# Patient Record
Sex: Female | Born: 1990 | Race: White | Hispanic: No | Marital: Single | Smoking: Current every day smoker
Health system: Southern US, Community
[De-identification: ages and names within clinical notes are randomized; demographics above are authoritative.]

## PROBLEM LIST (undated history)

## (undated) DIAGNOSIS — G43909 Migraine, unspecified, not intractable, without status migrainosus: Secondary | ICD-10-CM

## (undated) DIAGNOSIS — N930 Postcoital and contact bleeding: Secondary | ICD-10-CM

## (undated) DIAGNOSIS — N83201 Unspecified ovarian cyst, right side: Secondary | ICD-10-CM

## (undated) DIAGNOSIS — E041 Nontoxic single thyroid nodule: Secondary | ICD-10-CM

## (undated) DIAGNOSIS — R309 Painful micturition, unspecified: Secondary | ICD-10-CM

## (undated) DIAGNOSIS — Z309 Encounter for contraceptive management, unspecified: Secondary | ICD-10-CM

## (undated) DIAGNOSIS — N949 Unspecified condition associated with female genital organs and menstrual cycle: Secondary | ICD-10-CM

## (undated) DIAGNOSIS — R5383 Other fatigue: Secondary | ICD-10-CM

## (undated) DIAGNOSIS — S42309A Unspecified fracture of shaft of humerus, unspecified arm, initial encounter for closed fracture: Secondary | ICD-10-CM

## (undated) DIAGNOSIS — R1011 Right upper quadrant pain: Secondary | ICD-10-CM

## (undated) HISTORY — PX: WISDOM TOOTH EXTRACTION: SHX21

## (undated) HISTORY — DX: Right upper quadrant pain: R10.11

## (undated) HISTORY — DX: Other fatigue: R53.83

## (undated) HISTORY — DX: Nontoxic single thyroid nodule: E04.1

## (undated) HISTORY — DX: Unspecified condition associated with female genital organs and menstrual cycle: N94.9

## (undated) HISTORY — DX: Encounter for contraceptive management, unspecified: Z30.9

## (undated) HISTORY — DX: Unspecified ovarian cyst, right side: N83.201

## (undated) HISTORY — DX: Migraine, unspecified, not intractable, without status migrainosus: G43.909

## (undated) HISTORY — DX: Unspecified fracture of shaft of humerus, unspecified arm, initial encounter for closed fracture: S42.309A

## (undated) HISTORY — DX: Painful micturition, unspecified: R30.9

## (undated) HISTORY — DX: Postcoital and contact bleeding: N93.0

---
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
## 2009-04-21 ENCOUNTER — Other Ambulatory Visit: Admission: RE | Admit: 2009-04-21 | Discharge: 2009-04-21 | Payer: Self-pay | Admitting: Obstetrics and Gynecology

## 2009-08-13 ENCOUNTER — Encounter: Admission: RE | Admit: 2009-08-13 | Discharge: 2009-08-13 | Payer: Self-pay | Admitting: Oral Surgery

## 2010-05-05 ENCOUNTER — Other Ambulatory Visit: Payer: Self-pay | Admitting: Otolaryngology

## 2010-05-05 DIAGNOSIS — R490 Dysphonia: Secondary | ICD-10-CM

## 2010-05-06 ENCOUNTER — Ambulatory Visit
Admission: RE | Admit: 2010-05-06 | Discharge: 2010-05-06 | Disposition: A | Discharge status: Home / Self Care | Payer: BC Managed Care – PPO | Source: Ambulatory Visit | Attending: Otolaryngology | Admitting: Otolaryngology

## 2010-05-06 DIAGNOSIS — R490 Dysphonia: Secondary | ICD-10-CM

## 2010-08-11 ENCOUNTER — Other Ambulatory Visit: Payer: Self-pay | Admitting: Adult Health

## 2010-08-11 ENCOUNTER — Other Ambulatory Visit (HOSPITAL_COMMUNITY)
Admission: RE | Admit: 2010-08-11 | Discharge: 2010-08-11 | Disposition: A | Discharge status: Home / Self Care | Payer: BC Managed Care – PPO | Source: Ambulatory Visit | Attending: Obstetrics and Gynecology | Admitting: Obstetrics and Gynecology

## 2010-08-11 DIAGNOSIS — Z01419 Encounter for gynecological examination (general) (routine) without abnormal findings: Secondary | ICD-10-CM | POA: Insufficient documentation

## 2010-08-11 DIAGNOSIS — Z113 Encounter for screening for infections with a predominantly sexual mode of transmission: Secondary | ICD-10-CM | POA: Insufficient documentation

## 2011-03-18 ENCOUNTER — Other Ambulatory Visit: Payer: Self-pay | Admitting: Endocrinology

## 2011-03-18 DIAGNOSIS — E041 Nontoxic single thyroid nodule: Secondary | ICD-10-CM

## 2011-03-19 ENCOUNTER — Other Ambulatory Visit: Payer: BC Managed Care – PPO

## 2011-03-23 ENCOUNTER — Ambulatory Visit
Admission: RE | Admit: 2011-03-23 | Discharge: 2011-03-23 | Disposition: A | Discharge status: Home / Self Care | Payer: BC Managed Care – PPO | Source: Ambulatory Visit | Attending: Endocrinology | Admitting: Endocrinology

## 2011-03-23 DIAGNOSIS — E041 Nontoxic single thyroid nodule: Secondary | ICD-10-CM

## 2012-09-04 ENCOUNTER — Other Ambulatory Visit: Payer: Self-pay | Admitting: Adult Health

## 2012-09-14 ENCOUNTER — Other Ambulatory Visit: Payer: Self-pay | Admitting: Adult Health

## 2012-09-26 ENCOUNTER — Other Ambulatory Visit (HOSPITAL_COMMUNITY)
Admission: RE | Admit: 2012-09-26 | Discharge: 2012-09-26 | Disposition: A | Discharge status: Home / Self Care | Payer: BC Managed Care – PPO | Source: Ambulatory Visit | Attending: Obstetrics and Gynecology | Admitting: Obstetrics and Gynecology

## 2012-09-26 ENCOUNTER — Ambulatory Visit (INDEPENDENT_AMBULATORY_CARE_PROVIDER_SITE_OTHER): Payer: BC Managed Care – PPO | Admitting: Adult Health

## 2012-09-26 ENCOUNTER — Encounter: Payer: Self-pay | Admitting: Adult Health

## 2012-09-26 VITALS — BP 120/80 | HR 76 | Ht 63.0 in | Wt 168.0 lb

## 2012-09-26 DIAGNOSIS — Z01419 Encounter for gynecological examination (general) (routine) without abnormal findings: Secondary | ICD-10-CM

## 2012-09-26 DIAGNOSIS — Z309 Encounter for contraceptive management, unspecified: Secondary | ICD-10-CM

## 2012-09-26 DIAGNOSIS — E041 Nontoxic single thyroid nodule: Secondary | ICD-10-CM

## 2012-09-26 HISTORY — DX: Encounter for contraceptive management, unspecified: Z30.9

## 2012-09-26 MED ORDER — NORGESTIMATE-ETH ESTRADIOL 0.25-35 MG-MCG PO TABS: ORAL_TABLET | ORAL | Status: DC

## 2012-09-26 NOTE — Progress Notes (Signed)
Patient ID: Rachel Higgins, female   DOB: 05-11-1990, 22 y.o.   MRN: [id number] History of Present Illness: Letitia is a 22 year old white female engaged in for a pap and physical.   Current Medications, Allergies, Past Medical History, Past Surgical History, Family History and Social History were reviewed in [employer] medical record.     Review of Systems: Patient denies any headaches, blurred vision, shortness of breath, chest pain, abdominal pain, problems with bowel movements, urination, or intercourse. No joint pain or mood changes.    Physical Exam:BP 120/80  Pulse 76  Ht 5\' 3"  (1.6 m)  Wt 168 lb (76.204 kg)  BMI 29.77 kg/m2  LMP 08/29/2012 General:  Well developed, well nourished, no acute distress  Skin:  Warm and dry, tan Neck:  Midline trachea, right thyroid nodule(sees Dr Evlyn Kanner) Lungs; Clear to auscultation bilaterally Breast:  No dominant palpable mass, retraction, or nipple discharge Cardiovascular: Regular rate and rhythm Abdomen:  Soft, non tender, no hepatosplenomegaly Pelvic:  External genitalia is normal in appearance.  The vagina is normal in appearance. The cervix is nulleparous.Pap performed with thin prep.  Uterus is felt to be normal size, shape, and contour.  No adnexal masses or tenderness noted. Extremities:  No swelling or varicosities noted Psych:  Alert and cooperative, seems happy   Impression: Yearly gyn exam Contraceptive management Right thyroid nodule  Plan: Physical in 1 year Refilled Sprintec x 1 year, set phone to remind her to take

## 2012-09-26 NOTE — Patient Instructions (Addendum)
Continue pills Follow up in 1 year

## 2012-10-16 ENCOUNTER — Telehealth: Payer: Self-pay | Admitting: Obstetrics and Gynecology

## 2012-10-16 NOTE — Telephone Encounter (Signed)
Pt states taking Sprintec 28 x 2 years, starting heavy period and cramps. Call transferred to front staff for next available appt with provider.

## 2013-02-16 ENCOUNTER — Encounter (HOSPITAL_COMMUNITY): Payer: Self-pay | Admitting: Emergency Medicine

## 2013-02-16 DIAGNOSIS — M542 Cervicalgia: Secondary | ICD-10-CM | POA: Insufficient documentation

## 2013-02-16 DIAGNOSIS — R52 Pain, unspecified: Secondary | ICD-10-CM | POA: Insufficient documentation

## 2013-02-16 DIAGNOSIS — G8929 Other chronic pain: Secondary | ICD-10-CM | POA: Insufficient documentation

## 2013-02-16 DIAGNOSIS — R51 Headache: Secondary | ICD-10-CM | POA: Insufficient documentation

## 2013-02-16 DIAGNOSIS — Z3202 Encounter for pregnancy test, result negative: Secondary | ICD-10-CM | POA: Insufficient documentation

## 2013-02-16 DIAGNOSIS — F17200 Nicotine dependence, unspecified, uncomplicated: Secondary | ICD-10-CM | POA: Insufficient documentation

## 2013-02-16 DIAGNOSIS — B9789 Other viral agents as the cause of diseases classified elsewhere: Secondary | ICD-10-CM | POA: Insufficient documentation

## 2013-02-16 DIAGNOSIS — Z79899 Other long term (current) drug therapy: Secondary | ICD-10-CM | POA: Insufficient documentation

## 2013-02-16 NOTE — ED Notes (Signed)
Pt states she has nodule on thyroid that is swelling and causing muscle spasms in her neck and states she has been running a fever tonight.

## 2013-02-17 ENCOUNTER — Emergency Department (HOSPITAL_COMMUNITY)
Admission: EM | Admit: 2013-02-17 | Discharge: 2013-02-17 | Disposition: A | Discharge status: Home / Self Care | Payer: BC Managed Care – PPO | Attending: Emergency Medicine | Admitting: Emergency Medicine

## 2013-02-17 DIAGNOSIS — B349 Viral infection, unspecified: Secondary | ICD-10-CM

## 2013-02-17 LAB — CBC WITH DIFFERENTIAL/PLATELET
Basophils Absolute: 0 10*3/uL (ref 0.0–0.1)
Lymphs Abs: 1.6 10*3/uL (ref 0.7–4.0)
MCH: 33.2 pg (ref 26.0–34.0)
MCV: 95 fL (ref 78.0–100.0)
Monocytes Relative: 10 % (ref 3–12)
Neutro Abs: 3.3 10*3/uL (ref 1.7–7.7)
Neutrophils Relative %: 60 % (ref 43–77)
Platelets: 188 10*3/uL (ref 150–400)

## 2013-02-17 LAB — URINALYSIS, ROUTINE W REFLEX MICROSCOPIC
Glucose, UA: NEGATIVE mg/dL
Ketones, ur: NEGATIVE mg/dL
Nitrite: NEGATIVE
pH: 6 (ref 5.0–8.0)

## 2013-02-17 LAB — BASIC METABOLIC PANEL
Creatinine, Ser: 0.74 mg/dL (ref 0.50–1.10)
GFR calc Af Amer: >90 mL/min (ref >90)
Glucose, Bld: 112 mg/dL — ABNORMAL HIGH (ref 70–99)
Potassium: 3.9 mEq/L (ref 3.5–5.1)

## 2013-02-17 LAB — URINE MICROSCOPIC-ADD ON

## 2013-02-17 LAB — RAPID STREP SCREEN (MED CTR MEBANE ONLY): Streptococcus, Group A Screen (Direct): NEGATIVE

## 2013-02-17 NOTE — ED Provider Notes (Signed)
CSN: [id number]     Arrival date & time 02/16/13  2242 History   None    Chief Complaint  Patient presents with  . Neck Pain   (Consider location/radiation/quality/duration/timing/severity/associated sxs/prior Treatment) HPI This is a 22 year old female with a long-standing history of a "thyroid cyst" sitting just posterior to her right sternocleidomastoid muscle. It is associated with chronic neck pain and headaches. She is here with a two-day history of fever to 99.5 (which she states is high for her; her temperature is normally 93 or 94), chills, body aches, increased right-sided neck pain and tender lymph nodes, notably of her neck and right axilla. She is at increased first but no significant increase in urine output. She denies dysuria, nausea, vomiting or diarrhea. She denies cough, dyspnea or chest pain.  Past Medical History  Diagnosis Date  . Contraceptive management 09/26/2012   Past Surgical History  Procedure Laterality Date  . Wisdom tooth extraction    . Tonsillectomy     Family History  Problem Relation Age of Onset  . Vision loss Mother   . Thyroid disease Maternal Aunt   . Cancer Maternal Grandfather     thyroid   . Cancer Paternal Grandmother     breast, lung   . Stroke Paternal Grandmother   . Arthritis Paternal Grandmother   . COPD Paternal Grandmother   . Heart disease Paternal Grandmother   . Diabetes Paternal Grandfather    History  Substance Use Topics  . Smoking status: Current Every Day Smoker -- 0.50 packs/day for 4 years    Types: Cigarettes  . Smokeless tobacco: Never Used  . Alcohol Use: No   OB History   Grav Para Term Preterm Abortions TAB SAB Ect Mult Living                 Review of Systems  All other systems reviewed and are negative.    Allergies  Review of patient's allergies indicates no known allergies.  Home Medications   Current Outpatient Rx  Name  Route  Sig  Dispense  Refill  . norgestimate-ethinyl estradiol  (SPRINTEC 28) 0.25-35 MG-MCG tablet      TAKE ONE TABLET BY MOUTH EVERY DAY   1 Package   11    BP 130/98  Pulse 108  Temp(Src) 98.1 F (36.7 C) (Oral)  Resp 20  Ht 5\' 2"  (1.575 m)  Wt 160 lb (72.576 kg)  BMI 29.26 kg/m2  SpO2 98%  LMP 01/18/2013  Physical Exam General: Well-developed, well-nourished female in no acute distress; appearance consistent with age of record HENT: normocephalic; atraumatic; no pharyngeal erythema or exudate Eyes: pupils equal, round and reactive to light; extraocular muscles intact Neck: supple; small, tender nodule posterior to right sternocleidomastoid muscle; tender cervical lymph nodes without significant enlargement Heart: regular rate and rhythm; no murmurs, rubs or gallops Lungs: clear to auscultation bilaterally Abdomen: soft; nondistended; nontender; no masses or hepatosplenomegaly; bowel sounds present; no inguinal lymphadenopathy Extremities: No deformity; full range of motion; pulses normal; tender right axillary lymph nodes without significant enlargement Neurologic: Awake, alert and oriented; motor function intact in all extremities and symmetric; no facial droop Skin: Warm and dry Psychiatric: Normal mood and affect    ED Course  Procedures (including critical care time)    MDM   Nursing notes and vitals signs, including pulse oximetry, reviewed.  Summary of this visit's results, reviewed by myself:  Labs:  Results for orders placed during the hospital encounter of 02/17/13 (from the  past 24 hour(s))  RAPID STREP SCREEN     Status: None   Collection Time    02/17/13  1:11 AM      Result Value Range   Streptococcus, Group A Screen (Direct) NEGATIVE  NEGATIVE  MONONUCLEOSIS SCREEN     Status: None   Collection Time    02/17/13  1:14 AM      Result Value Range   Mono Screen NEGATIVE  NEGATIVE  CBC WITH DIFFERENTIAL     Status: None   Collection Time    02/17/13  1:14 AM      Result Value Range   WBC 5.5  4.0 - 10.5  K/uL   RBC 4.37  3.87 - 5.11 MIL/uL   Hemoglobin 14.5  12.0 - 15.0 g/dL   HCT 95.6  21.3 - 08.6 %   MCV 95.0  78.0 - 100.0 fL   MCH 33.2  26.0 - 34.0 pg   MCHC 34.9  30.0 - 36.0 g/dL   RDW 57.8  46.9 - 62.9 %   Platelets 188  150 - 400 K/uL   Neutrophils Relative % 60  43 - 77 %   Neutro Abs 3.3  1.7 - 7.7 K/uL   Lymphocytes Relative 29  12 - 46 %   Lymphs Abs 1.6  0.7 - 4.0 K/uL   Monocytes Relative 10  3 - 12 %   Monocytes Absolute 0.6  0.1 - 1.0 K/uL   Eosinophils Relative 0  0 - 5 %   Eosinophils Absolute 0.0  0.0 - 0.7 K/uL   Basophils Relative 0  0 - 1 %   Basophils Absolute 0.0  0.0 - 0.1 K/uL  BASIC METABOLIC PANEL     Status: Abnormal   Collection Time    02/17/13  1:14 AM      Result Value Range   Sodium 137  135 - 145 mEq/L   Potassium 3.9  3.5 - 5.1 mEq/L   Chloride 101  96 - 112 mEq/L   CO2 25  19 - 32 mEq/L   Glucose, Bld 112 (*) 70 - 99 mg/dL   BUN 8  6 - 23 mg/dL   Creatinine, Ser 5.28  0.50 - 1.10 mg/dL   Calcium 9.0  8.4 - 41.3 mg/dL   GFR calc non Af Amer >90  >90 mL/min   GFR calc Af Amer >90  >90 mL/min  URINALYSIS, ROUTINE W REFLEX MICROSCOPIC     Status: Abnormal   Collection Time    02/17/13  1:16 AM      Result Value Range   Color, Urine YELLOW  YELLOW   APPearance CLEAR  CLEAR   Specific Gravity, Urine 1.015  1.005 - 1.030   pH 6.0  5.0 - 8.0   Glucose, UA NEGATIVE  NEGATIVE mg/dL   Hgb urine dipstick LARGE (*) NEGATIVE   Bilirubin Urine NEGATIVE  NEGATIVE   Ketones, ur NEGATIVE  NEGATIVE mg/dL   Protein, ur NEGATIVE  NEGATIVE mg/dL   Urobilinogen, UA 0.2  0.0 - 1.0 mg/dL   Nitrite NEGATIVE  NEGATIVE   Leukocytes, UA SMALL (*) NEGATIVE  PREGNANCY, URINE     Status: None   Collection Time    02/17/13  1:16 AM      Result Value Range   Preg Test, Ur NEGATIVE  NEGATIVE  URINE MICROSCOPIC-ADD ON     Status: Abnormal   Collection Time    02/17/13  1:16 AM      Result  Value Range   Squamous Epithelial / LPF FEW (*) RARE   WBC, UA 3-6  <3  WBC/hpf   RBC / HPF 3-6  <3 RBC/hpf   Bacteria, UA RARE  RARE    Patient symptoms are consistent with a viral illness. She was advised to return for worsening or changing symptoms.   Hanley Seamen, MD 02/17/13 [phone]

## 2013-02-19 LAB — CULTURE, GROUP A STREP

## 2013-07-03 ENCOUNTER — Encounter: Payer: Self-pay | Admitting: Adult Health

## 2013-07-03 ENCOUNTER — Ambulatory Visit (INDEPENDENT_AMBULATORY_CARE_PROVIDER_SITE_OTHER): Payer: BC Managed Care – PPO | Admitting: Adult Health

## 2013-07-03 VITALS — BP 130/82 | Ht 63.0 in | Wt 188.0 lb

## 2013-07-03 DIAGNOSIS — R309 Painful micturition, unspecified: Secondary | ICD-10-CM

## 2013-07-03 DIAGNOSIS — Z3202 Encounter for pregnancy test, result negative: Secondary | ICD-10-CM

## 2013-07-03 DIAGNOSIS — N949 Unspecified condition associated with female genital organs and menstrual cycle: Secondary | ICD-10-CM | POA: Insufficient documentation

## 2013-07-03 DIAGNOSIS — R300 Dysuria: Secondary | ICD-10-CM

## 2013-07-03 DIAGNOSIS — R319 Hematuria, unspecified: Secondary | ICD-10-CM

## 2013-07-03 HISTORY — DX: Hematuria, unspecified: R31.9

## 2013-07-03 HISTORY — DX: Painful micturition, unspecified: R30.9

## 2013-07-03 HISTORY — DX: Unspecified condition associated with female genital organs and menstrual cycle: N94.9

## 2013-07-03 LAB — SEDIMENTATION RATE: SED RATE: 10 mm/h (ref 0–22)

## 2013-07-03 LAB — CBC
HEMATOCRIT: 41.3 % (ref 36.0–46.0)
Hemoglobin: 14.3 g/dL (ref 12.0–15.0)
MCH: 32.2 pg (ref 26.0–34.0)
MCHC: 34.6 g/dL (ref 30.0–36.0)
MCV: 93 fL (ref 78.0–100.0)
Platelets: 314 10*3/uL (ref 150–400)
RBC: 4.44 MIL/uL (ref 3.87–5.11)
RDW: 13.3 % (ref 11.5–15.5)
WBC: 11.1 10*3/uL — AB (ref 4.0–10.5)

## 2013-07-03 LAB — COMPREHENSIVE METABOLIC PANEL
ALBUMIN: 4.1 g/dL (ref 3.5–5.2)
ALT: 37 U/L — AB (ref 0–35)
AST: 26 U/L (ref 0–37)
Alkaline Phosphatase: 56 U/L (ref 39–117)
BUN: 9 mg/dL (ref 6–23)
CO2: 27 meq/L (ref 19–32)
Calcium: 9.8 mg/dL (ref 8.4–10.5)
Chloride: 104 mEq/L (ref 96–112)
Creat: 0.72 mg/dL (ref 0.50–1.10)
Glucose, Bld: 71 mg/dL (ref 70–99)
Potassium: 4.4 mEq/L (ref 3.5–5.3)
Sodium: 139 mEq/L (ref 135–145)
Total Bilirubin: 0.3 mg/dL (ref 0.2–1.2)
Total Protein: 6.9 g/dL (ref 6.0–8.3)

## 2013-07-03 LAB — POCT URINALYSIS DIPSTICK
Blood, UA: 1
GLUCOSE UA: NEGATIVE
Ketones, UA: NEGATIVE
Leukocytes, UA: NEGATIVE
NITRITE UA: NEGATIVE
PROTEIN UA: NEGATIVE

## 2013-07-03 LAB — POCT URINE PREGNANCY: PREG TEST UR: NEGATIVE

## 2013-07-03 MED ORDER — IBUPROFEN 800 MG PO TABS: 800.0000 mg | ORAL_TABLET | Freq: Three times a day (TID) | ORAL | Status: DC | PRN

## 2013-07-03 MED ORDER — PHENAZOPYRIDINE HCL 200 MG PO TABS: 200.0000 mg | ORAL_TABLET | Freq: Three times a day (TID) | ORAL | Status: DC | PRN

## 2013-07-03 MED ORDER — SULFAMETHOXAZOLE-TMP DS 800-160 MG PO TABS: 1.0000 | ORAL_TABLET | Freq: Two times a day (BID) | ORAL | Status: DC

## 2013-07-03 NOTE — Patient Instructions (Signed)
Hematuria, Adult Hematuria is blood in your urine. It can be caused by a bladder infection, kidney infection, prostate infection, kidney stone, or cancer of your urinary tract. Infections can usually be treated with medicine, and a kidney stone usually will pass through your urine. If neither of these is the cause of your hematuria, further workup to find out the reason may be needed. It is very important that you tell your health care provider about any blood you see in your urine, even if the blood stops without treatment or happens without causing pain. Blood in your urine that happens and then stops and then happens again can be a symptom of a very serious condition. Also, pain is not a symptom in the initial stages of many urinary cancers. HOME CARE INSTRUCTIONS  Drink lots of fluid, 3 4 quarts a day. If you have been diagnosed with an infection, cranberry juice is especially recommended, in addition to large amounts of water. Avoid caffeine, tea, and carbonated beverages, because they tend to irritate the bladder. Avoid alcohol because it may irritate the prostate. Only take over-the-counter or prescription medicines for pain, discomfort, or fever as directed by your health care provider. If you have been diagnosed with a kidney stone, follow your health care provider's instructions regarding straining your urine to catch the stone. Empty your bladder often. Avoid holding urine for long periods of time. After a bowel movement, women should cleanse front to back. Use each tissue only once. Empty your bladder before and after sexual intercourse if you are a female. SEEK MEDICAL CARE IF: You develop back pain, fever, a feeling of sickness in your stomach (nausea), or vomiting or if your symptoms are not better in 3 days. Return sooner if you are getting worse. SEEK IMMEDIATE MEDICAL CARE IF:  You have a persistent fever, with a temperature of 101.90F (38.8C) or greater. You develop severe  vomiting and are unable to keep the medicine down. You develop severe back or abdominal pain despite taking your medicines. You begin passing a large amount of blood or clots in your urine. You feel extremely weak or faint, or you pass out. MAKE SURE YOU:  Understand these instructions. Will watch your condition. Will get help right away if you are not doing well or get worse. Document Released: 02/15/2005 Document Revised: 12/06/2012 Document Reviewed: 10/16/2012 [facility] Patient Information 2014 [place], [place]. Urinary Tract Infection Urinary tract infections (UTIs) can develop anywhere along your urinary tract. Your urinary tract is your body's drainage system for removing wastes and extra water. Your urinary tract includes two kidneys, two ureters, a bladder, and a urethra. Your kidneys are a pair of bean-shaped organs. Each kidney is about the size of your fist. They are located below your ribs, one on each side of your spine. CAUSES Infections are caused by microbes, which are microscopic organisms, including fungi, viruses, and bacteria. These organisms are so small that they can only be seen through a microscope. Bacteria are the microbes that most commonly cause UTIs. SYMPTOMS  Symptoms of UTIs may vary by age and gender of the patient and by the location of the infection. Symptoms in young women typically include a frequent and intense urge to urinate and a painful, burning feeling in the bladder or urethra during urination. Older women and men are more likely to be tired, shaky, and weak and have muscle aches and abdominal pain. A fever may mean the infection is in your kidneys. Other symptoms of a kidney infection  include pain in your back or sides below the ribs, nausea, and vomiting. DIAGNOSIS To diagnose a UTI, your caregiver will ask you about your symptoms. Your caregiver also will ask to provide a urine sample. The urine sample will be tested for bacteria and white blood cells.  White blood cells are made by your body to help fight infection. TREATMENT  Typically, UTIs can be treated with medication. Because most UTIs are caused by a bacterial infection, they usually can be treated with the use of antibiotics. The choice of antibiotic and length of treatment depend on your symptoms and the type of bacteria causing your infection. HOME CARE INSTRUCTIONS  If you were prescribed antibiotics, take them exactly as your caregiver instructs you. Finish the medication even if you feel better after you have only taken some of the medication.  Drink enough water and fluids to keep your urine clear or pale yellow.  Avoid caffeine, tea, and carbonated beverages. They tend to irritate your bladder.  Empty your bladder often. Avoid holding urine for long periods of time.  Empty your bladder before and after sexual intercourse.  After a bowel movement, women should cleanse from front to back. Use each tissue only once. SEEK MEDICAL CARE IF:   You have back pain.  You develop a fever.  Your symptoms do not begin to resolve within 3 days. SEEK IMMEDIATE MEDICAL CARE IF:   You have severe back pain or lower abdominal pain.  You develop chills.  You have nausea or vomiting.  You have continued burning or discomfort with urination. MAKE SURE YOU:   Understand these instructions.  Will watch your condition.  Will get help right away if you are not doing well or get worse. Document Released: 11/25/2004 Document Revised: 08/17/2011 Document Reviewed: 03/26/2011 ExitCare Patient Information 2014 [employer], [place]. push fluids Take meds IF pain increases, or fever over 102 or start vomiting go to ER return 1 week

## 2013-07-03 NOTE — Progress Notes (Signed)
Subjective:     Patient ID: Rachel Higgins, female   DOB: [birth year], 23 y.o.   MRN: [id number]  HPI Laramie is a 23 year old white female in complaining of painful urination and pressure over her bladder and low grade fever at home, highest 101.The symptoms started Friday,and she tried AZO.Has had some nausea no vomiting.Is off OCs but using condoms, no new partners.  Review of Systems See HPI Reviewed past medical,surgical, social and family history. Reviewed medications and allergies.     Objective:   Physical Exam BP 130/82  Ht _0  (1.6 m)  Wt 188 lb (85.276 kg)  BMI 33.31 kg/m2  LMP 04/21/2015UPT negative,urine dipstick 1+blood,Skin warm and dry.Pelvic: external genitalia is normal in appearance, vagina: scant discharge without odor,no lesions noted, cervix:smooth, negative CMT, uterus: normal size, shape and contour, non tender, no masses felt, adnexa: no masses, has tenderness noted over bladder and near navel area.No rebound tenderness noted.Has some LLQ tenderness with palpation.Has some pain over back but no CVAT.     Assessment:     Painful urination Hematuria Pelvic pain    Plan:     Rx septra ds 1 bid x 7 days #14 no refills Rx pyridium 200 mg #10 1 tid no refills Rx motrin 800 mg #60 1 every 8 hours prn pain with 1 refill Push fluids If increase in pain, or fever over 102 or vomiting go to ER Review handouts on UTI and hematuria Check CBC,CMP,ESR and GC/CHL and [name] C&S Return in 1 week but will talk in am

## 2013-07-04 ENCOUNTER — Telehealth: Payer: Self-pay | Admitting: Adult Health

## 2013-07-04 LAB — URINALYSIS
BILIRUBIN URINE: NEGATIVE
Glucose, UA: NEGATIVE mg/dL
KETONES UR: NEGATIVE mg/dL
Leukocytes, UA: NEGATIVE
NITRITE: NEGATIVE
PH: 7 (ref 5.0–8.0)
Protein, ur: NEGATIVE mg/dL
UROBILINOGEN UA: 0.2 mg/dL (ref 0.0–1.0)

## 2013-07-04 LAB — GC/CHLAMYDIA PROBE AMP
CT PROBE, AMP APTIMA: NEGATIVE
GC Probe RNA: NEGATIVE

## 2013-07-04 NOTE — Telephone Encounter (Signed)
Pt feels better is aware of labs,continue taking meds

## 2013-07-05 LAB — URINE CULTURE
COLONY COUNT: NO GROWTH
ORGANISM ID, BACTERIA: NO GROWTH

## 2013-07-10 ENCOUNTER — Encounter: Payer: Self-pay | Admitting: Adult Health

## 2013-07-10 ENCOUNTER — Ambulatory Visit (INDEPENDENT_AMBULATORY_CARE_PROVIDER_SITE_OTHER): Payer: BC Managed Care – PPO | Admitting: Adult Health

## 2013-07-10 VITALS — BP 140/88 | Ht 63.0 in | Wt 189.0 lb

## 2013-07-10 DIAGNOSIS — N949 Unspecified condition associated with female genital organs and menstrual cycle: Secondary | ICD-10-CM

## 2013-07-10 NOTE — Progress Notes (Signed)
Subjective:     Patient ID: Rachel Higgins, female   DOB: 07/26/1990, 23 y.o.   MRN: [id number]  HPI Rachel Higgins is a 23 year old white female back in follow up for pain.She feels much better but still has pressure and discomfort when bladder full.Has just finished septra ds.  Review of Systems See HPI Reviewed past medical,surgical, social and family history. Reviewed medications and allergies.     Objective:   Physical Exam BP 140/88  Ht '5\' 3"'  (1.6 m)  Wt 189 lb (85.73 kg)  BMI 33.49 kg/m2  LMP 06/19/2013   Has tenderness over abdomen and pelvic area, seems more tender bladder and RUQ Dr Rachel Higgins in to examine.reviewed labs again with pt.GC/CHL negative,urine culture no growth,WBC 11.1.ESR 10.  Assessment:     Pelvic and abdominal pain    Plan:     Get abdominal  US  5/14 at 9 am at [facility] Return Friday for pelvic US and see me   Decrease fried,spicey foods and cigaretes and carbonated drinks, May be IC related but will get [name] first

## 2013-07-10 NOTE — Patient Instructions (Signed)
Decrease cigs Decrease carbonated drinks [facility] be there at 8:45 DO NOT EAT OR DRINK AFTER NIDNIGHT Return 5/15 for US and see me

## 2013-07-12 ENCOUNTER — Other Ambulatory Visit: Payer: Self-pay | Admitting: Adult Health

## 2013-07-12 ENCOUNTER — Ambulatory Visit (HOSPITAL_COMMUNITY)
Admission: RE | Admit: 2013-07-12 | Discharge: 2013-07-12 | Disposition: A | Discharge status: Home / Self Care | Payer: BC Managed Care – PPO | Source: Ambulatory Visit | Attending: Adult Health | Admitting: Adult Health

## 2013-07-12 DIAGNOSIS — N949 Unspecified condition associated with female genital organs and menstrual cycle: Secondary | ICD-10-CM

## 2013-07-12 DIAGNOSIS — R1011 Right upper quadrant pain: Secondary | ICD-10-CM | POA: Insufficient documentation

## 2013-07-13 ENCOUNTER — Encounter: Payer: Self-pay | Admitting: Adult Health

## 2013-07-13 ENCOUNTER — Ambulatory Visit (INDEPENDENT_AMBULATORY_CARE_PROVIDER_SITE_OTHER): Payer: BC Managed Care – PPO | Admitting: Adult Health

## 2013-07-13 ENCOUNTER — Ambulatory Visit (INDEPENDENT_AMBULATORY_CARE_PROVIDER_SITE_OTHER): Payer: BC Managed Care – PPO

## 2013-07-13 VITALS — BP 130/84 | Ht 63.0 in | Wt 188.0 lb

## 2013-07-13 DIAGNOSIS — N83201 Unspecified ovarian cyst, right side: Secondary | ICD-10-CM

## 2013-07-13 DIAGNOSIS — N949 Unspecified condition associated with female genital organs and menstrual cycle: Secondary | ICD-10-CM

## 2013-07-13 DIAGNOSIS — N83209 Unspecified ovarian cyst, unspecified side: Secondary | ICD-10-CM

## 2013-07-13 HISTORY — DX: Unspecified ovarian cyst, right side: N83.201

## 2013-07-13 NOTE — Progress Notes (Signed)
Subjective:     Patient ID: Rachel Higgins, female   DOB: December 07, 1990, 23 y.o.   MRN: [id number]  HPI [name] is back for US for pelvic pain, had GB US yesterday.  Review of Systems See HPI Reviewed past medical,surgical, social and family history. Reviewed medications and allergies.     Objective:   Physical Exam BP 130/84  Ht 5\' 3"  (1.6 m)  Wt 188 lb (85.276 kg)  BMI 33.31 kg/m2  LMP 06/19/2013   Reviewed GB US and pelvic US.  EXAM: US ABDOMEN LIMITED - RIGHT UPPER QUADRANT  COMPARISON: None.  FINDINGS: Gallbladder:  No gallstones, gallbladder wall thickening, or pericholecystic fluid. Cholesterolosis of the gallbladder wall, suggesting adenomyomatosis. Negative sonographic Murphy's sign.  Common bile duct:  Diameter: 4 mm.  Liver:  At the upper limits of normal for parenchymal echogenicity. No focal hepatic lesion is seen.  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  Negative right upper quadrant ultrasound. Uterus 6.7 x 3.5 x 2.7 cm, no myometrial masses noted  Endometrium 6.9 mm, symmetrical,  Right ovary 4.4 x 2.6 x 2.4 cm, with 2.2 x 1.8cm complex cyst noted with internal debris noted within, +perfused ovary  Left ovary 3.0 x 2.4 x 1.3 cm,  No free fluid noted within the pelvis  Technician Comments:  Anteverted uterus noted, Endom=6.729mm symmetrical, Rt ovary with 2.2 x 1.8cm complex (?hemorrhagic cyst) cystic area noted, lt ovary appears wnl, no free fluid noted within the pelvis  Feels better than before.      Assessment:    Pelvic pain and abdominal pain Right ovarian cyst GB adenomyomatosis    Plan:     Can watch for now, as declines OCs for ovarian suppression, and try to eat less spicy and greasy foods and if pain persists will refer to surgeon for consult Return in 8 weeks for pelvic [place]

## 2013-07-13 NOTE — Patient Instructions (Signed)
Ovarian Cyst An ovarian cyst is a fluid-filled sac that forms on an ovary. The ovaries are small organs that produce eggs in women. Various types of cysts can form on the ovaries. Most are not cancerous. Many do not cause problems, and they often go away on their own. Some may cause symptoms and require treatment. Common types of ovarian cysts include:  Functional cysts These cysts may occur every month during the menstrual cycle. This is normal. The cysts usually go away with the next menstrual cycle if the woman does not get pregnant. Usually, there are no symptoms with a functional cyst.  Endometrioma cysts These cysts form from the tissue that lines the uterus. They are also called "chocolate cysts" because they become filled with blood that turns brown. This type of cyst can cause pain in the lower abdomen during intercourse and with your menstrual period.  Cystadenoma cysts This type develops from the cells on the outside of the ovary. These cysts can get very big and cause lower abdomen pain and pain with intercourse. This type of cyst can twist on itself, cut off its blood supply, and cause severe pain. It can also easily rupture and cause a lot of pain.  Dermoid cysts This type of cyst is sometimes found in both ovaries. These cysts may contain different kinds of body tissue, such as skin, teeth, hair, or cartilage. They usually do not cause symptoms unless they get very big.  Theca lutein cysts These cysts occur when too much of a certain hormone (human chorionic gonadotropin) is produced and overstimulates the ovaries to produce an egg. This is most common after procedures used to assist with the conception of a baby (in vitro fertilization). CAUSES   Fertility drugs can cause a condition in which multiple large cysts are formed on the ovaries. This is called ovarian hyperstimulation syndrome.  A condition called polycystic ovary syndrome can cause hormonal imbalances that can lead to  nonfunctional ovarian cysts. SIGNS AND SYMPTOMS  Many ovarian cysts do not cause symptoms. If symptoms are present, they may include:  Pelvic pain or pressure.  Pain in the lower abdomen.  Pain during sexual intercourse.  Increasing girth (swelling) of the abdomen.  Abnormal menstrual periods.  Increasing pain with menstrual periods.  Stopping having menstrual periods without being pregnant. DIAGNOSIS  These cysts are commonly found during a routine or annual pelvic exam. Tests may be ordered to find out more about the cyst. These tests may include:  Ultrasound.  X-ray of the pelvis.  CT scan.  MRI.  Blood tests. TREATMENT  Many ovarian cysts go away on their own without treatment. Your health care provider may want to check your cyst regularly for 2 3 months to see if it changes. For women in menopause, it is particularly important to monitor a cyst closely because of the higher rate of ovarian cancer in menopausal women. When treatment is needed, it may include any of the following:  A procedure to drain the cyst (aspiration). This may be done using a long needle and ultrasound. It can also be done through a laparoscopic procedure. This involves using a thin, lighted tube with a tiny camera on the end (laparoscope) inserted through a small incision.  Surgery to remove the whole cyst. This may be done using laparoscopic surgery or an open surgery involving a larger incision in the lower abdomen.  Hormone treatment or birth control pills. These methods are sometimes used to help dissolve a cyst. HOME CARE   INSTRUCTIONS   Only take over-the-counter or prescription medicines as directed by your health care provider.  Follow up with your health care provider as directed.  Get regular pelvic exams and Pap tests. SEEK MEDICAL CARE IF:   Your periods are late, irregular, or painful, or they stop.  Your pelvic pain or abdominal pain does not go away.  Your abdomen becomes  larger or swollen.  You have pressure on your bladder or trouble emptying your bladder completely.  You have pain during sexual intercourse.  You have feelings of fullness, pressure, or discomfort in your stomach.  You lose weight for no apparent reason.  You feel generally ill.  You become constipated.  You lose your appetite.  You develop acne.  You have an increase in body and facial hair.  You are gaining weight, without changing your exercise and eating habits.  You think you are pregnant. SEEK IMMEDIATE MEDICAL CARE IF:   You have increasing abdominal pain.  You feel sick to your stomach (nauseous), and you throw up (vomit).  You develop a fever that comes on suddenly.  You have abdominal pain during a bowel movement.  Your menstrual periods become heavier than usual. Document Released: 02/15/2005 Document Revised: 12/06/2012 Document Reviewed: 10/23/2012 [facility] Patient Information 2014 [place]Care, [place]. Return in  8 weeks for US   Gall bladder adenomyomatosis

## 2013-07-19 ENCOUNTER — Telehealth: Payer: Self-pay

## 2013-07-19 NOTE — Telephone Encounter (Signed)
Pt requesting something for pain (back pain and right side), Ibuprofen not working, also requesting a referral for surgeon due to gallbladder. Per Cyril MourningJennifer Griffin, NP have pt to be seen tomorrow at 8:30 am for pain medication and referral. Front staff informed to add to schedule.

## 2013-07-20 ENCOUNTER — Ambulatory Visit (INDEPENDENT_AMBULATORY_CARE_PROVIDER_SITE_OTHER): Payer: BC Managed Care – PPO | Admitting: Adult Health

## 2013-07-20 ENCOUNTER — Encounter: Payer: Self-pay | Admitting: Adult Health

## 2013-07-20 VITALS — BP 132/82 | Ht 63.0 in | Wt 190.0 lb

## 2013-07-20 DIAGNOSIS — R1011 Right upper quadrant pain: Secondary | ICD-10-CM

## 2013-07-20 HISTORY — DX: Right upper quadrant pain: R10.11

## 2013-07-20 MED ORDER — HYDROCODONE-ACETAMINOPHEN 5-325 MG PO TABS: 1.0000 | ORAL_TABLET | Freq: Four times a day (QID) | ORAL | Status: DC | PRN

## 2013-07-20 NOTE — Patient Instructions (Signed)
appt with Dr Lovell Sheehan 5/28 at 10 am, be there at 9:45 am [phone]

## 2013-07-20 NOTE — Progress Notes (Signed)
Subjective:     Patient ID: Rachel Higgins, female   DOB: 03/18/1990, 23 y.o.   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  Also has right ovarian cyst, that we are following.  Review of Systems See HPI Reviewed past medical,surgical, social and family history. Reviewed medications and allergies.      Objective:   Physical Exam BP 132/82  Ht 5\' 3"  (1.6 m)  Wt 190 lb (86.183 kg)  BMI 33.67 kg/m2  LMP 05/18/2015talk only,   see HPI Assessment:     RUQ pain GB adenomyomatosis    Plan:    Eat bland Rx norco 5-325 mg #30 1 every 6 hours prn pain no refills appt made with Dr Lovell Sheehan for 5/28 at 10 am Follow up prn

## 2013-08-08 NOTE — H&P (Signed)
  NTS SOAP Note  Vital Signs:  Vitals as of: 08/08/2013: Systolic 149: Diastolic 86: Heart Rate 101: Temp 98.67F: Height 23ft 3in: Weight 188Lbs 0 Ounces: Pain Level 2: BMI 33.3  BMI : 33.3 kg/m2  Subjective: This 23 Years 2 Months old Female presents for of biliary colic.  Has been having frequent right upper quadrant painwith radiation to right flank and nausea. No fever, chills, or jaundice have been noted. Ultrasound the gallbladder reveals adenomyotosis of the gallbladder. Most foods now seemed to make her sick, though fatty foods do potentiate this.  Review of Symptoms:  Constitutional:  fatigue    headache Eyes:unremarkable   Nose/Mouth/Throat:unremarkable Cardiovascular:  unremarkable   Respiratory:unremarkable   Gastrointestin    abdominal pain Genitourinary:    dysuria,frequency   neck and back pain Skin:unremarkable Hematolgic/Lymphatic:unremarkable     Allergic/Immunologic:unremarkable     Past Medical History:    Reviewed  Past Medical History  Surgical History: none Medical Problems: none Allergies: nkda Medications: vicodin for pain   Social History:Reviewed  Social History  Preferred Language: English Race:  White Ethnicity: Not Hispanic / Latino Age: 23 Years 4 Months Marital Status:  S Alcohol: no Recreational drug(s): no   Smoking Status: Current every day smoker reviewed on 07/27/2013 Started Date:  Packs per day: 0.50 Functional Status reviewed on 07/27/2013 ------------------------------------------------ Bathing: Normal Cooking: Normal Dressing: Normal Driving: Normal Eating: Normal Managing Meds: Normal Oral Care: Normal Shopping: Normal Toileting: Normal Transferring: Normal Walking: Normal Cognitive Status reviewed on 07/27/2013 ------------------------------------------------ Attention: Normal Decision Making: Normal Language: Normal Memory: Normal Motor: Normal Perception:  Normal Problem Solving: Normal Visual and Spatial: Normal   Family History:  Reviewed  Family Health History Mother, Living; Cervical cancer;  Father, Deceased; Healthy; healthy    Objective Information: General:  Well appearing, well nourished in no distress.   no scleral icterus Heart:  RRR, no murmur or gallop.  Normal S1, S2.  No S3, S4.  Lungs:    CTA bilaterally, no wheezes, rhonchi, rales.  Breathing unlabored. Abdomen:Soft, tender in the right upper quadrant to deep palpation,ND, normal bowel sounds, no HSM, no masses.  No peritoneal signs.  Assessment:biliary colic, chronic cholecystitis  Diagnoses: 575.11 Chronic cholecystitis (Chronic cholecystitis)  Procedures: 56314 - OFFICE OUTPATIENT NEW 30 MINUTES    Plan:scheduled for laparoscopic cholecystectomy on 09/03/2013.   Patient Education:Alternative treatments to surgery were discussed with patient (and family).  Risks and benefits  of procedure including bleeding, infection, hepatobiliary injury, and the possibility of an open procedure were fully explained to the patient (and family) who gave informed consent. Patient/family questions were addressed.  Follow-up:Pending Surgery

## 2013-08-16 ENCOUNTER — Other Ambulatory Visit (HOSPITAL_COMMUNITY): Payer: BC Managed Care – PPO

## 2013-08-17 ENCOUNTER — Encounter (HOSPITAL_COMMUNITY): Payer: Self-pay | Admitting: Pharmacy Technician

## 2013-08-21 ENCOUNTER — Other Ambulatory Visit (HOSPITAL_COMMUNITY): Payer: BC Managed Care – PPO

## 2013-08-27 ENCOUNTER — Encounter (HOSPITAL_COMMUNITY)
Admission: RE | Admit: 2013-08-27 | Discharge: 2013-08-27 | Disposition: A | Discharge status: Home / Self Care | Payer: BC Managed Care – PPO | Source: Ambulatory Visit | Attending: General Surgery | Admitting: General Surgery

## 2013-08-27 ENCOUNTER — Encounter (HOSPITAL_COMMUNITY): Payer: Self-pay

## 2013-08-27 DIAGNOSIS — Z01812 Encounter for preprocedural laboratory examination: Secondary | ICD-10-CM | POA: Insufficient documentation

## 2013-08-27 LAB — CBC WITH DIFFERENTIAL/PLATELET
BASOS ABS: 0 10*3/uL (ref 0.0–0.1)
Basophils Relative: 0 % (ref 0–1)
EOS ABS: 0.1 10*3/uL (ref 0.0–0.7)
Eosinophils Relative: 1 % (ref 0–5)
HCT: 41.2 % (ref 36.0–46.0)
Hemoglobin: 14.2 g/dL (ref 12.0–15.0)
Lymphocytes Relative: 23 % (ref 12–46)
Lymphs Abs: 2.4 10*3/uL (ref 0.7–4.0)
MCH: 32.2 pg (ref 26.0–34.0)
MCHC: 34.5 g/dL (ref 30.0–36.0)
MCV: 93.4 fL (ref 78.0–100.0)
Monocytes Absolute: 0.8 10*3/uL (ref 0.1–1.0)
Monocytes Relative: 8 % (ref 3–12)
NEUTROS ABS: 7.1 10*3/uL (ref 1.7–7.7)
Neutrophils Relative %: 68 % (ref 43–77)
PLATELETS: 285 10*3/uL (ref 150–400)
RBC: 4.41 MIL/uL (ref 3.87–5.11)
RDW: 13.1 % (ref 11.5–15.5)
WBC: 10.4 10*3/uL (ref 4.0–10.5)

## 2013-08-27 LAB — HEPATIC FUNCTION PANEL
ALBUMIN: 3.8 g/dL (ref 3.5–5.2)
ALK PHOS: 60 U/L (ref 39–117)
ALT: 35 U/L (ref 0–35)
AST: 28 U/L (ref 0–37)
BILIRUBIN TOTAL: 0.2 mg/dL — AB (ref 0.3–1.2)
Total Protein: 7 g/dL (ref 6.0–8.3)

## 2013-08-27 LAB — BASIC METABOLIC PANEL
BUN: 10 mg/dL (ref 6–23)
CALCIUM: 9.5 mg/dL (ref 8.4–10.5)
CO2: 24 mEq/L (ref 19–32)
Chloride: 102 mEq/L (ref 96–112)
Creatinine, Ser: 0.66 mg/dL (ref 0.50–1.10)
GLUCOSE: 107 mg/dL — AB (ref 70–99)
Potassium: 4.3 mEq/L (ref 3.7–5.3)
SODIUM: 140 meq/L (ref 137–147)

## 2013-08-27 LAB — HCG, SERUM, QUALITATIVE: Preg, Serum: NEGATIVE

## 2013-08-27 MED ORDER — CHLORHEXIDINE GLUCONATE 4 % EX LIQD: 1.0000 "application " | Freq: Once | CUTANEOUS | Status: DC

## 2013-08-27 NOTE — Patient Instructions (Signed)
Rachel Higgins  08/27/2013   Your procedure is scheduled on:  09/03/13  Report to Rachel Higgins at 06:15 AM.  Call this number if you have problems the morning of surgery: [phone]   Remember:   Do not eat food or drink liquids after midnight.   Take these medicines the morning of surgery with A SIP OF WATER: Hydrocodone if needed.   Do not wear jewelry, make-up or nail polish.  Do not wear lotions, powders, or perfumes. You may wear deodorant.  Do not shave 48 hours prior to surgery. Men may shave face and neck.  Do not bring valuables to the hospital.  [facility] is not responsible for any belongings or valuables.               Contacts, dentures or bridgework may not be worn into surgery.  Leave suitcase in the car. After surgery it may be brought to your room.  For patients admitted to the hospital, discharge time is determined by your treatment team.               Patients discharged the day of surgery will not be allowed to drive home.    Special Instructions: Shower using Hibiclens the night before surgery and the morning of surgery.   Please read over the following fact sheets that you were given: Anesthesia Post-op Instructions and Care and Recovery After Surgery    Laparoscopic Cholecystectomy Laparoscopic cholecystectomy is surgery to remove the gallbladder. The gallbladder is located in the upper right part of the abdomen, behind the liver. It is a storage sac for bile produced in the liver. Bile aids in the digestion and absorption of fats. Cholecystectomy is often done for inflammation of the gallbladder (cholecystitis). This condition is usually caused by a buildup of gallstones (cholelithiasis) in your gallbladder. Gallstones can block the flow of bile, resulting in inflammation and pain. In severe cases, emergency surgery may be required. When emergency surgery is not required, you will have time to prepare for the procedure. Laparoscopic surgery is an alternative to  open surgery. Laparoscopic surgery has a shorter recovery time. Your common bile duct may also need to be examined during the procedure. If stones are found in the common bile duct, they may be removed. LET [facility] Rachel Higgins Higgins Higgins:  Any allergies you have.  All medicines you are taking, including vitamins, herbs, eye drops, creams, and over-the-counter medicines.  Previous problems you or members of your family have had with the use of anesthetics.  Any blood disorders you have.  Previous surgeries you have had.  Medical conditions you have. RISKS AND COMPLICATIONS Generally, this is a safe procedure. However, as with any procedure, complications can occur. Possible complications include:  Infection.  Damage to the common bile duct, nerves, arteries, veins, or other internal organs such as the stomach, liver, or intestines.  Bleeding.  A stone may remain in the common bile duct.  A bile leak from the cyst duct that is clipped when your gallbladder is removed.  The need to convert to open surgery, which requires a larger incision in the abdomen. This may be necessary if your surgeon thinks it is not safe to continue with a laparoscopic procedure. BEFORE THE PROCEDURE  Ask your health care Rachel Higgins or stopping any regular medicines. You will need to stop taking aspirin or blood thinners at least 5 days prior to surgery.  Do not eat or drink anything after midnight the night  before surgery.  Let your health care Rachel Higgins if you develop a cold or other infectious problem before surgery. PROCEDURE   You will be given medicine to make you sleep through the procedure (general anesthetic). A breathing tube will be placed in your mouth.  When you are asleep, your surgeon will make several small cuts (incisions) in your abdomen.  A thin, lighted tube with a tiny camera on the end (laparoscope) is inserted through one of the small incisions. The camera  on the laparoscope sends a picture to a TV screen in the operating room. This gives the surgeon a good view inside your abdomen.  A gas will be pumped into your abdomen. This expands your abdomen so that the surgeon has more room to perform the surgery.  Other tools needed for the procedure are inserted through the other incisions. The gallbladder is removed through one of the incisions.  After the removal of your gallbladder, the incisions will be closed with stitches, staples, or skin glue. AFTER THE PROCEDURE  You will be taken to a recovery area where your progress will be checked often.  You may be allowed to go home the same day if your pain is controlled and you can tolerate liquids. Document Released: 02/15/2005 Document Revised: 12/06/2012 Document Reviewed: 09/27/2012 [facility] Patient Information 2015 [employer] [place]. This information is not intended to replace advice given to you by your health care Rachel Higgins. Make sure you discuss any questions you have with your health care Rachel Higgins.    Laparoscopic Cholecystectomy, Care After Refer to this sheet in the next few weeks. These instructions provide you with information on caring for yourself after your procedure. Your health care Rachel Higgins may also give you more specific instructions. Your treatment has been planned according to current medical practices, but problems sometimes occur. Call your health care Rachel Higgins if you have any problems or questions after your procedure. WHAT TO EXPECT AFTER THE PROCEDURE After your procedure, it is typical to have the following:  Pain at your incision sites. You will be given pain medicines to control the pain.  Mild nausea or vomiting. This should improve after the first 24 hours.  Bloating and possibly shoulder pain from the gas used during the procedure. This will improve after the first 24 hours. HOME CARE INSTRUCTIONS   Change bandages (dressings) as directed by your health care  Rachel Mcauley.  Keep the wound dry and clean. You may wash the wound gently with soap and water. Gently blot or dab the area dry.  Do not take baths or use swimming pools or hot tubs for 2 weeks or until your health care Rhianon Zabawa approves.  Only take over-the-counter or prescription medicines as directed by your health care Sharmane Dame.  Continue your normal diet as directed by your health care Bj Morlock.  Do not lift anything heavier than 10 pounds (4.5 kg) until your health care Kendle Erker approves.  Do not play contact sports for 1 week or until your health care Kratos Ruscitti approves. SEEK MEDICAL CARE IF:   You have redness, swelling, or increasing pain in the wound.  You notice yellowish-white fluid (pus) coming from the wound.  You have drainage from the wound that lasts longer than 1 day.  You notice a bad smell coming from the wound or dressing.  Your surgical cuts (incisions) break open. SEEK IMMEDIATE MEDICAL CARE IF:   You develop a rash.  You have difficulty breathing.  You have chest pain.  You have a fever.  You  have increasing pain in the shoulders (shoulder strap areas).  You have dizzy episodes or faint while standing.  You have severe abdominal pain.  You feel sick to your stomach (nauseous) or throw up (vomit) and this lasts for more than 1 day. Document Released: 02/15/2005 Document Revised: 12/06/2012 Document Reviewed: 09/27/2012 [facility] Patient Information 2015 [place], [place]. This information is not intended to replace advice given to you by your health care Sheriff Rodenberg. Make sure you discuss any questions you have with your health care Tywon Niday.    PATIENT INSTRUCTIONS POST-ANESTHESIA  IMMEDIATELY FOLLOWING SURGERY:  Do not drive or operate machinery for the first twenty four hours after surgery.  Do not make any important decisions for twenty four hours after surgery or while taking narcotic pain medications or sedatives.  If you develop intractable nausea  and vomiting or a severe headache please notify your doctor immediately.  FOLLOW-UP:  Please make an appointment with your surgeon as instructed. You do not need to follow up with anesthesia unless specifically instructed to do so.  WOUND CARE INSTRUCTIONS (if applicable):  Keep a dry clean dressing on the anesthesia/puncture wound site if there is drainage.  Once the wound has quit draining you may leave it open to air.  Generally you should leave the bandage intact for twenty four hours unless there is drainage.  If the epidural site drains for more than 36-48 hours please call the anesthesia department.  QUESTIONS?:  Please feel free to call your physician or the hospital operator if you have any questions, and they will be happy to assist you.

## 2013-09-03 ENCOUNTER — Encounter (HOSPITAL_COMMUNITY): Payer: BC Managed Care – PPO | Admitting: Anesthesiology

## 2013-09-03 ENCOUNTER — Ambulatory Visit (HOSPITAL_COMMUNITY): Payer: BC Managed Care – PPO | Admitting: Anesthesiology

## 2013-09-03 ENCOUNTER — Ambulatory Visit (HOSPITAL_COMMUNITY)
Admission: RE | Admit: 2013-09-03 | Discharge: 2013-09-03 | Disposition: A | Discharge status: Home / Self Care | Payer: BC Managed Care – PPO | Source: Ambulatory Visit | Attending: General Surgery | Admitting: General Surgery

## 2013-09-03 ENCOUNTER — Encounter (HOSPITAL_COMMUNITY): Payer: Self-pay | Admitting: *Deleted

## 2013-09-03 ENCOUNTER — Encounter (HOSPITAL_COMMUNITY)
Admission: RE | Disposition: A | Discharge status: Home / Self Care | Payer: Self-pay | Source: Ambulatory Visit | Attending: General Surgery

## 2013-09-03 DIAGNOSIS — F17200 Nicotine dependence, unspecified, uncomplicated: Secondary | ICD-10-CM | POA: Insufficient documentation

## 2013-09-03 DIAGNOSIS — K811 Chronic cholecystitis: Secondary | ICD-10-CM | POA: Insufficient documentation

## 2013-09-03 HISTORY — PX: CHOLECYSTECTOMY: SHX55

## 2013-09-03 SURGERY — LAPAROSCOPIC CHOLECYSTECTOMY
Anesthesia: General | Site: Abdomen

## 2013-09-03 MED ORDER — DEXAMETHASONE SODIUM PHOSPHATE 4 MG/ML IJ SOLN
4.0000 mg | Freq: Once | INTRAMUSCULAR | Status: AC
  Administered 2013-09-03: 4 mg via INTRAVENOUS

## 2013-09-03 MED ORDER — SODIUM CHLORIDE 0.9 % IR SOLN
Status: DC | PRN
  Administered 2013-09-03: 1000 mL

## 2013-09-03 MED ORDER — NEOSTIGMINE METHYLSULFATE 10 MG/10ML IV SOLN
INTRAVENOUS | Status: DC | PRN
  Administered 2013-09-03: 3 mg via INTRAVENOUS

## 2013-09-03 MED ORDER — MIDAZOLAM HCL 2 MG/2ML IJ SOLN
1.0000 mg | INTRAMUSCULAR | Status: DC | PRN
  Administered 2013-09-03: 2 mg via INTRAVENOUS

## 2013-09-03 MED ORDER — ROCURONIUM BROMIDE 100 MG/10ML IV SOLN
INTRAVENOUS | Status: DC | PRN
  Administered 2013-09-03: 5 mg via INTRAVENOUS
  Administered 2013-09-03: 25 mg via INTRAVENOUS

## 2013-09-03 MED ORDER — BUPIVACAINE HCL (PF) 0.5 % IJ SOLN
INTRAMUSCULAR | Status: AC
  Filled 2013-09-03: qty 30

## 2013-09-03 MED ORDER — SUCCINYLCHOLINE CHLORIDE 20 MG/ML IJ SOLN
INTRAMUSCULAR | Status: DC | PRN
  Administered 2013-09-03: 110 mg via INTRAVENOUS

## 2013-09-03 MED ORDER — GLYCOPYRROLATE 0.2 MG/ML IJ SOLN
0.2000 mg | Freq: Once | INTRAMUSCULAR | Status: AC
  Administered 2013-09-03: 0.2 mg via INTRAVENOUS

## 2013-09-03 MED ORDER — GLYCOPYRROLATE 0.2 MG/ML IJ SOLN
INTRAMUSCULAR | Status: AC
  Filled 2013-09-03: qty 1

## 2013-09-03 MED ORDER — LIDOCAINE HCL (PF) 1 % IJ SOLN
INTRAMUSCULAR | Status: AC
  Filled 2013-09-03: qty 5

## 2013-09-03 MED ORDER — SUCCINYLCHOLINE CHLORIDE 20 MG/ML IJ SOLN
INTRAMUSCULAR | Status: AC
  Filled 2013-09-03: qty 1

## 2013-09-03 MED ORDER — ONDANSETRON HCL 4 MG/2ML IJ SOLN
INTRAMUSCULAR | Status: AC
  Filled 2013-09-03: qty 2

## 2013-09-03 MED ORDER — HYDROCODONE-ACETAMINOPHEN 5-325 MG PO TABS: 1.0000 | ORAL_TABLET | ORAL | Status: DC | PRN

## 2013-09-03 MED ORDER — LACTATED RINGERS IV SOLN: INTRAVENOUS | Status: DC

## 2013-09-03 MED ORDER — CEFAZOLIN SODIUM-DEXTROSE 2-3 GM-% IV SOLR
2.0000 g | INTRAVENOUS | Status: AC
  Administered 2013-09-03: 2 g via INTRAVENOUS

## 2013-09-03 MED ORDER — GLYCOPYRROLATE 0.2 MG/ML IJ SOLN
INTRAMUSCULAR | Status: AC
Start: 2013-09-03 — End: 2013-09-03
  Filled 2013-09-03: qty 2

## 2013-09-03 MED ORDER — POVIDONE-IODINE 10 % OINT PACKET
TOPICAL_OINTMENT | CUTANEOUS | Status: DC | PRN
  Administered 2013-09-03: 1 via TOPICAL

## 2013-09-03 MED ORDER — CEFAZOLIN SODIUM-DEXTROSE 2-3 GM-% IV SOLR
INTRAVENOUS | Status: AC
  Filled 2013-09-03: qty 50

## 2013-09-03 MED ORDER — MIDAZOLAM HCL 2 MG/2ML IJ SOLN
INTRAMUSCULAR | Status: AC
  Filled 2013-09-03: qty 2

## 2013-09-03 MED ORDER — DEXAMETHASONE SODIUM PHOSPHATE 4 MG/ML IJ SOLN
INTRAMUSCULAR | Status: AC
  Filled 2013-09-03: qty 1

## 2013-09-03 MED ORDER — FENTANYL CITRATE 0.05 MG/ML IJ SOLN
INTRAMUSCULAR | Status: DC | PRN
  Administered 2013-09-03 (×5): 50 ug via INTRAVENOUS

## 2013-09-03 MED ORDER — BUPIVACAINE HCL (PF) 0.5 % IJ SOLN
INTRAMUSCULAR | Status: DC | PRN
  Administered 2013-09-03: 10 mL

## 2013-09-03 MED ORDER — PROPOFOL 10 MG/ML IV BOLUS
INTRAVENOUS | Status: AC
Start: 2013-09-03 — End: 2013-09-03
  Filled 2013-09-03: qty 20

## 2013-09-03 MED ORDER — ROCURONIUM BROMIDE 50 MG/5ML IV SOLN
INTRAVENOUS | Status: AC
  Filled 2013-09-03: qty 1

## 2013-09-03 MED ORDER — NEOSTIGMINE METHYLSULFATE 10 MG/10ML IV SOLN
INTRAVENOUS | Status: AC
  Filled 2013-09-03: qty 1

## 2013-09-03 MED ORDER — GLYCOPYRROLATE 0.2 MG/ML IJ SOLN
INTRAMUSCULAR | Status: DC | PRN
  Administered 2013-09-03: .5 mg via INTRAVENOUS

## 2013-09-03 MED ORDER — FENTANYL CITRATE 0.05 MG/ML IJ SOLN
INTRAMUSCULAR | Status: AC
  Filled 2013-09-03: qty 5

## 2013-09-03 MED ORDER — KETOROLAC TROMETHAMINE 30 MG/ML IJ SOLN
30.0000 mg | Freq: Once | INTRAMUSCULAR | Status: AC
  Administered 2013-09-03: 30 mg via INTRAVENOUS
  Filled 2013-09-03: qty 1

## 2013-09-03 MED ORDER — PROPOFOL 10 MG/ML IV BOLUS
INTRAVENOUS | Status: DC | PRN
  Administered 2013-09-03: 160 mg via INTRAVENOUS
  Administered 2013-09-03: 40 mg via INTRAVENOUS

## 2013-09-03 MED ORDER — LACTATED RINGERS IV SOLN
INTRAVENOUS | Status: DC
  Administered 2013-09-03 (×2): via INTRAVENOUS

## 2013-09-03 MED ORDER — ONDANSETRON HCL 4 MG/2ML IJ SOLN
4.0000 mg | Freq: Once | INTRAMUSCULAR | Status: AC
  Administered 2013-09-03: 4 mg via INTRAVENOUS

## 2013-09-03 MED ORDER — ONDANSETRON HCL 4 MG/2ML IJ SOLN: 4.0000 mg | Freq: Once | INTRAMUSCULAR | Status: DC | PRN

## 2013-09-03 MED ORDER — POVIDONE-IODINE 10 % EX OINT
TOPICAL_OINTMENT | CUTANEOUS | Status: AC
  Filled 2013-09-03: qty 1

## 2013-09-03 MED ORDER — LIDOCAINE HCL 1 % IJ SOLN
INTRAMUSCULAR | Status: DC | PRN
  Administered 2013-09-03: 30 mg via INTRADERMAL

## 2013-09-03 MED ORDER — HEMOSTATIC AGENTS (NO CHARGE) OPTIME
TOPICAL | Status: DC | PRN
  Administered 2013-09-03: 1 via TOPICAL

## 2013-09-03 MED ORDER — FENTANYL CITRATE 0.05 MG/ML IJ SOLN
25.0000 ug | INTRAMUSCULAR | Status: DC | PRN
  Administered 2013-09-03 (×4): 50 ug via INTRAVENOUS
  Filled 2013-09-03 (×2): qty 2

## 2013-09-03 SURGICAL SUPPLY — 47 items
APPLIER CLIP LAPSCP 10X32 DD (CLIP) ×2 IMPLANT
BAG HAMPER (MISCELLANEOUS) ×2 IMPLANT
BAG SPEC RTRVL LRG 6X4 10 (ENDOMECHANICALS) ×1
BLADE 11 SAFETY STRL DISP (BLADE) ×2 IMPLANT
CLOTH BEACON ORANGE TIMEOUT ST (SAFETY) ×2 IMPLANT
COVER LIGHT HANDLE STERIS (MISCELLANEOUS) ×4 IMPLANT
DECANTER SPIKE VIAL GLASS SM (MISCELLANEOUS) ×2 IMPLANT
DRAPE LAPAROTOMY TRNSV 102X78 (DRAPE) ×1 IMPLANT
DRSG TEGADERM 2-3/8X2-3/4 SM (GAUZE/BANDAGES/DRESSINGS) ×8 IMPLANT
DURAPREP 26ML APPLICATOR (WOUND CARE) ×2 IMPLANT
ELECT REM PT RETURN 9FT ADLT (ELECTROSURGICAL) ×2
ELECTRODE REM PT RTRN 9FT ADLT (ELECTROSURGICAL) ×1 IMPLANT
EVACUATOR SMOKE 8.L (FILTER) ×2 IMPLANT
FILTER SMOKE EVAC LAPAROSHD (FILTER) ×2 IMPLANT
FORMALIN 10 PREFIL 120ML (MISCELLANEOUS) ×2 IMPLANT
GAUZE SPONGE 4X4 16PLY XRAY LF (GAUZE/BANDAGES/DRESSINGS) ×1 IMPLANT
GLOVE BIO SURGEON STRL SZ7 (GLOVE) ×4 IMPLANT
GLOVE BIOGEL PI IND STRL 7.0 (GLOVE) IMPLANT
GLOVE BIOGEL PI IND STRL 7.5 (GLOVE) IMPLANT
GLOVE BIOGEL PI INDICATOR 7.0 (GLOVE) ×2
GLOVE BIOGEL PI INDICATOR 7.5 (GLOVE) ×1
GLOVE SURG SS PI 7.5 STRL IVOR (GLOVE) ×2 IMPLANT
GOWN STRL REUS W/TWL LRG LVL3 (GOWN DISPOSABLE) ×6 IMPLANT
HEMOSTAT SNOW SURGICEL 2X4 (HEMOSTASIS) ×2 IMPLANT
INST SET LAPROSCOPIC AP (KITS) ×2 IMPLANT
IV NS IRRIG 3000ML ARTHROMATIC (IV SOLUTION) IMPLANT
KIT ROOM TURNOVER APOR (KITS) ×2 IMPLANT
MANIFOLD NEPTUNE II (INSTRUMENTS) ×2 IMPLANT
NDL INSUFFLATION 14GA 120MM (NEEDLE) ×1 IMPLANT
NEEDLE INSUFFLATION 14GA 120MM (NEEDLE) ×2 IMPLANT
NS IRRIG 1000ML POUR BTL (IV SOLUTION) ×2 IMPLANT
PACK LAP CHOLE LZT030E (CUSTOM PROCEDURE TRAY) ×2 IMPLANT
PAD ARMBOARD 7.5X6 YLW CONV (MISCELLANEOUS) ×2 IMPLANT
POUCH SPECIMEN RETRIEVAL 10MM (ENDOMECHANICALS) ×2 IMPLANT
SET BASIN LINEN APH (SET/KITS/TRAYS/PACK) ×2 IMPLANT
SET TUBE IRRIG SUCTION NO TIP (IRRIGATION / IRRIGATOR) IMPLANT
SLEEVE ENDOPATH XCEL 5M (ENDOMECHANICALS) ×2 IMPLANT
SPONGE GAUZE 2X2 8PLY STRL LF (GAUZE/BANDAGES/DRESSINGS) ×8 IMPLANT
STAPLER VISISTAT (STAPLE) ×2 IMPLANT
SUT VICRYL 0 UR6 27IN ABS (SUTURE) ×2 IMPLANT
TAPE CLOTH SURG 4X10 WHT LF (GAUZE/BANDAGES/DRESSINGS) ×2 IMPLANT
TROCAR ENDO BLADELESS 11MM (ENDOMECHANICALS) ×2 IMPLANT
TROCAR XCEL NON-BLD 5MMX100MML (ENDOMECHANICALS) ×2 IMPLANT
TROCAR XCEL UNIV SLVE 11M 100M (ENDOMECHANICALS) ×2 IMPLANT
TUBING INSUFFLATION (TUBING) ×2 IMPLANT
WARMER LAPAROSCOPE (MISCELLANEOUS) ×2 IMPLANT
YANKAUER SUCT 12FT TUBE ARGYLE (SUCTIONS) ×2 IMPLANT

## 2013-09-03 NOTE — Anesthesia Procedure Notes (Signed)
Procedure Name: Intubation Date/Time: 09/03/2013 7:43 AM Performed by: Glynn OctaveANIEL, Burrell Hodapp E Pre-anesthesia Checklist: Patient identified, Patient being monitored, Timeout performed, Emergency Drugs available and Suction available Patient Re-evaluated:Patient Re-evaluated prior to inductionOxygen Delivery Method: Circle System Utilized Preoxygenation: Pre-oxygenation with 100% oxygen Intubation Type: IV induction Ventilation: Mask ventilation without difficulty Laryngoscope Size: Mac and 3 Grade View: Grade I Tube type: Oral Tube size: 7.0 mm Number of attempts: 1 Airway Equipment and Method: stylet Placement Confirmation: ETT inserted through vocal cords under direct vision,  positive ETCO2 and breath sounds checked- equal and bilateral Secured at: 21 cm Tube secured with: Tape Dental Injury: Teeth and Oropharynx as per pre-operative assessment

## 2013-09-03 NOTE — Anesthesia Postprocedure Evaluation (Signed)
  Anesthesia Post-op Note  Patient: Rachel Higgins  Procedure(s) Performed: Procedure(s): LAPAROSCOPIC CHOLECYSTECTOMY (N/A)  Patient Location: PACU  Anesthesia Type:General  Level of Consciousness: awake  Airway and Oxygen Therapy: Patient Spontanous Breathing and Patient connected to face mask oxygen  Post-op Pain: none  Post-op Assessment: Post-op Vital signs reviewed, Patient's Cardiovascular Status Stable, Respiratory Function Stable, Patent Airway and No signs of Nausea or vomiting  Post-op Vital Signs: Reviewed and stable  Last Vitals:  Filed Vitals:   09/03/13 0838  Temp: 36.8 C    Complications: No apparent anesthesia complications

## 2013-09-03 NOTE — Op Note (Signed)
Patient:  Rachel Higgins  DOB:  03/18/1990  MRN:  [id number]   Preop Diagnosis:  Chronic cholecystitis   Postop Diagnosis:  Same   Procedure:  Laparoscopic cholecystectomy   Surgeon:  Franky MachoMark Luvena Wentling, MD  Anes:  GET  Indications:  Patient is a 23yo wf who presents with biliary colic secondary to chronic cholecystitis.  The risks and benefits of the procedure including bleeding, infection, hepatobiliary injury, and the possibility of an open procedure were fully explained to the patient, who gives informed consent.  Procedure note:  The patient was placed in the supine position.  After induction of GET, the abdomen was prepped and draped using the usual sterile technique with duraprep.  Surgical site confirmation was performed.  An infraumbilical incision was made down the fascia.  A veress needle was inserted and confirmation was done using the saline drop test.  The abdomen was insufflated to 16mm of mercury pressure.  An 70mm trochar was inserted under direct visualization without difficulty.  The patient was placed in reverse trandelenberg position and an additional 10070m trochar was placed in the epigastric region, and 5mm trochars were placed in the right upper quadrant and right flank regions without difficulty.  The liver was within normal limits.  The gallbladder was small.  It was retracted in a dynamic fashion in order to expose the triangle of Calot.  The cystic duct was first identified.  Its juncture to the infundibulum was fully identified.  Endoclips were placed proximally and distally on the cystic duct, and the cystic duct was divided.  This was likewise done to the cystic artery.  The gallbladder was freed away from the liver using bovie electrocautery.  The gallbladder was delivered thru the epigstric trochar site using an endocatch bag.  The liver bed was inspected and noted to be within normal limits.  Surgicel was placed in the gallbladder fossa.  All fluid and air were evacuated  from the abdomen prior to removal of the trochars.  All wounds were irrigated with normal saline.  All wounds were injected with Exparel.  The infraumbilical fascia was reapproximated using 0 vicryl suture.  All skin incisions were closed using staples.  Betadine ointment and dry sterile dressings were applied.  All tape and needle counts were correct and the end of the procedure.  The patient was extubated and transferred to the PACU in stable condition.  Complications:  none  EBL:  none  Specimen:  gallbladder

## 2013-09-03 NOTE — Transfer of Care (Signed)
Immediate Anesthesia Transfer of Care Note  Patient: Rachel Higgins  Procedure(s) Performed: Procedure(s): LAPAROSCOPIC CHOLECYSTECTOMY (N/A)  Patient Location: PACU  Anesthesia Type:General  Level of Consciousness: awake  Airway & Oxygen Therapy: Patient Spontanous Breathing and Patient connected to face mask oxygen  Post-op Assessment: Report given to PACU RN  Post vital signs: Reviewed and stable  Complications: No apparent anesthesia complications

## 2013-09-03 NOTE — Interval H&P Note (Signed)
History and Physical Interval Note:  09/03/2013 7:27 AM  Rachel Higgins  has presented today for surgery, with the diagnosis of chronic cholecystitis  The various methods of treatment have been discussed with the patient and family. After consideration of risks, benefits and other options for treatment, the patient has consented to  Procedure(s): LAPAROSCOPIC CHOLECYSTECTOMY (N/A) as a surgical intervention .  The patient's history has been reviewed, patient examined, no change in status, stable for surgery.  I have reviewed the patient's chart and labs.  Questions were answered to the patient's satisfaction.     Franky MachoJENKINS,Mariane Burpee A

## 2013-09-03 NOTE — Anesthesia Preprocedure Evaluation (Signed)
Anesthesia Evaluation  Patient identified by MRN, date of birth, ID band Patient awake    Reviewed: Allergy & Precautions, H&P , NPO status , Patient's Chart, lab work & pertinent test results  Airway Mallampati: I TM Distance: >3 FB Neck ROM: Full    Dental  (+) Teeth Intact   Pulmonary Current Smoker,  breath sounds clear to auscultation        Cardiovascular negative cardio ROS  Rhythm:Regular Rate:Normal     Neuro/Psych    GI/Hepatic negative GI ROS,   Endo/Other    Renal/GU      Musculoskeletal   Abdominal   Peds  Hematology   Anesthesia Other Findings   Reproductive/Obstetrics                           Anesthesia Physical Anesthesia Plan  ASA: I  Anesthesia Plan: General   Post-op Pain Management:    Induction: Intravenous  Airway Management Planned: Oral ETT  Additional Equipment:   Intra-op Plan:   Post-operative Plan: Extubation in OR  Informed Consent: I have reviewed the patients History and Physical, chart, labs and discussed the procedure including the risks, benefits and alternatives for the proposed anesthesia with the patient or authorized representative who has indicated his/her understanding and acceptance.     Plan Discussed with:   Anesthesia Plan Comments:         Anesthesia Quick Evaluation

## 2013-09-03 NOTE — Discharge Instructions (Signed)

## 2013-09-04 ENCOUNTER — Encounter (HOSPITAL_COMMUNITY): Payer: Self-pay | Admitting: General Surgery

## 2013-09-10 ENCOUNTER — Ambulatory Visit: Payer: BC Managed Care – PPO | Admitting: Adult Health

## 2013-09-10 ENCOUNTER — Other Ambulatory Visit: Payer: BC Managed Care – PPO

## 2013-09-24 ENCOUNTER — Other Ambulatory Visit: Payer: Self-pay | Admitting: Adult Health

## 2013-09-24 ENCOUNTER — Ambulatory Visit (INDEPENDENT_AMBULATORY_CARE_PROVIDER_SITE_OTHER): Payer: BC Managed Care – PPO | Admitting: Adult Health

## 2013-09-24 ENCOUNTER — Ambulatory Visit (INDEPENDENT_AMBULATORY_CARE_PROVIDER_SITE_OTHER): Payer: BC Managed Care – PPO

## 2013-09-24 ENCOUNTER — Encounter: Payer: Self-pay | Admitting: Adult Health

## 2013-09-24 VITALS — BP 128/82 | Ht 63.0 in | Wt 190.0 lb

## 2013-09-24 DIAGNOSIS — N949 Unspecified condition associated with female genital organs and menstrual cycle: Secondary | ICD-10-CM

## 2013-09-24 DIAGNOSIS — N83201 Unspecified ovarian cyst, right side: Secondary | ICD-10-CM

## 2013-09-24 DIAGNOSIS — N83209 Unspecified ovarian cyst, unspecified side: Secondary | ICD-10-CM

## 2013-09-24 NOTE — Progress Notes (Signed)
Subjective:     Patient ID: Rachel Higgins, female   DOB: 07-19-1990, 23 y.o.   MRN: [id number]  HPI Rachel Higgins is a 23 year old white female in for US for Ovarian cyst and pelvic pain.She had GB removed 09/03/13, by Dr Lovell SheehanJenkins.And feels better, has had some cramps with period and ovulation but declines OCs.Has also had some burning with urination but is better.  Review of Systems See HPI Reviewed past medical,surgical, social and family history. Reviewed medications and allergies.     Objective:   Physical Exam BP 128/82  Ht 5\' 3"  (1.6 m)  Wt 190 lb (86.183 kg)  BMI 33.67 kg/m2  LMP 07/08/2015Reviewed US with pt.   Uterus Anteverted no myometrial masses noted  Endometrium 5.0 mm, symmetrical,  Right ovary 4.3 x 2.7 x 2.1 cm, with 1.8 x 1.4 x 1.5cm hyperechoic area noted appears tot be resolving cyst  Left ovary 2.9 x 1.9 x 1.8 cm,  No free fluid noted within the pelvis  Technician Comments:  Anteverted uterus, Endom-5.220mm symmetrical, Lt ovary appears WNL, Rt ovary with 1.8 x 1.4 x 1.5cm hyperechoic area appears to be resolving cyst, no free fluid noted within the pelvis     Assessment:     Resolving right ovarian cyst Pelvic pain at times with period and ovulation    Plan:     Push fluids Take advil or tylenol for cramps/apin Return in 3 months for physical

## 2013-09-24 NOTE — Patient Instructions (Signed)
Push fluids  Take advil or tylenol Return in 3 months

## 2013-12-26 ENCOUNTER — Ambulatory Visit (INDEPENDENT_AMBULATORY_CARE_PROVIDER_SITE_OTHER): Payer: BC Managed Care – PPO | Admitting: Adult Health

## 2013-12-26 ENCOUNTER — Encounter: Payer: Self-pay | Admitting: Adult Health

## 2013-12-26 VITALS — BP 138/82 | HR 78 | Ht 62.5 in | Wt 194.5 lb

## 2013-12-26 DIAGNOSIS — Z01419 Encounter for gynecological examination (general) (routine) without abnormal findings: Secondary | ICD-10-CM

## 2013-12-26 DIAGNOSIS — R5383 Other fatigue: Secondary | ICD-10-CM

## 2013-12-26 HISTORY — DX: Other fatigue: R53.83

## 2013-12-26 LAB — COMPREHENSIVE METABOLIC PANEL
ALBUMIN: 4.1 g/dL (ref 3.5–5.2)
ALK PHOS: 64 U/L (ref 39–117)
ALT: 23 U/L (ref 0–35)
AST: 17 U/L (ref 0–37)
BUN: 11 mg/dL (ref 6–23)
CHLORIDE: 107 meq/L (ref 96–112)
CO2: 25 mEq/L (ref 19–32)
Calcium: 9.5 mg/dL (ref 8.4–10.5)
Creat: 0.66 mg/dL (ref 0.50–1.10)
Glucose, Bld: 98 mg/dL (ref 70–99)
POTASSIUM: 4.1 meq/L (ref 3.5–5.3)
SODIUM: 141 meq/L (ref 135–145)
TOTAL PROTEIN: 6.9 g/dL (ref 6.0–8.3)
Total Bilirubin: 0.2 mg/dL (ref 0.2–1.2)

## 2013-12-26 LAB — CBC
HCT: 41.8 % (ref 36.0–46.0)
Hemoglobin: 14.2 g/dL (ref 12.0–15.0)
MCH: 32 pg (ref 26.0–34.0)
MCHC: 34 g/dL (ref 30.0–36.0)
MCV: 94.1 fL (ref 78.0–100.0)
Platelets: 340 10*3/uL (ref 150–400)
RBC: 4.44 MIL/uL (ref 3.87–5.11)
RDW: 13.4 % (ref 11.5–15.5)
WBC: 11.4 10*3/uL — ABNORMAL HIGH (ref 4.0–10.5)

## 2013-12-26 LAB — TSH: TSH: 1.484 u[IU]/mL (ref 0.350–4.500)

## 2013-12-26 NOTE — Progress Notes (Signed)
Patient ID: Rachel Higgins, female   DOB: August 15, 1990, 23 y.o.   MRN: [id number] History of Present Illness: Rachel Higgins is a 23 year old white female,maried, in for gyn exam.She had a normal pap 09/26/12.She complains of being tired and sluggish and hot.   Current Medications, Allergies, Past Medical History, Past Surgical History, Family History and Social History were [facility].     Review of Systems: Patient denies any headaches, blurred vision, shortness of breath, chest pain, abdominal pain, problems with bowel movements, urination, or intercourse. No joint pain, no mood swings or depression.She has new job.    Physical Exam:BP 138/82  Pulse 78  Ht 5' 2.5" (1.588 m)  Wt 194 lb 8 oz (88.225 kg)  BMI 34.99 kg/m2  LMP 12/25/2013 General:  Well developed, well nourished, no acute distress Skin:  Warm and dry Neck:  Midline trachea, normal thyroid Lungs; Clear to auscultation bilaterally Breast:  No dominant palpable mass, retraction, or nipple discharge Cardiovascular: Regular rate and rhythm Abdomen:  Soft, non tender, no hepatosplenomegaly Pelvic:  External genitalia is normal in appearance.  The vagina has period like blood. The cervix is smooth.  Uterus is felt to be normal size, shape, and contour.  No  adnexal masses or tenderness noted. Extremities:  No swelling or varicosities noted Psych:  No mood changes,alert and cooperative,seems happy.Encouraged to decrease cigarettes. She is using condoms and she and husband are thinking about a baby,take OTC prenatal vitamin with folic acid.Will get labs to assess tiredness.  Impression: Well woman gyn exam no pap Tired     Plan: Check CBC.CMP,TSH, will talk when results back Take OTC prenatal vitamin Pap and physical in 1 year

## 2013-12-26 NOTE — Patient Instructions (Signed)
Take OTC prenatal vitamin with folic acid Pap and physical in 1 year Decrease cigarettes

## 2013-12-27 ENCOUNTER — Telehealth: Payer: Self-pay | Admitting: Adult Health

## 2013-12-27 NOTE — Telephone Encounter (Signed)
Pt aware labs good 

## 2014-12-30 ENCOUNTER — Ambulatory Visit (INDEPENDENT_AMBULATORY_CARE_PROVIDER_SITE_OTHER): Payer: BLUE CROSS/BLUE SHIELD | Admitting: Adult Health

## 2014-12-30 ENCOUNTER — Other Ambulatory Visit (HOSPITAL_COMMUNITY)
Admission: RE | Admit: 2014-12-30 | Discharge: 2014-12-30 | Disposition: A | Discharge status: Home / Self Care | Payer: BLUE CROSS/BLUE SHIELD | Source: Ambulatory Visit | Attending: Adult Health | Admitting: Adult Health

## 2014-12-30 ENCOUNTER — Encounter: Payer: Self-pay | Admitting: Adult Health

## 2014-12-30 VITALS — BP 120/80 | HR 80 | Ht 63.0 in | Wt 203.0 lb

## 2014-12-30 DIAGNOSIS — Z01419 Encounter for gynecological examination (general) (routine) without abnormal findings: Secondary | ICD-10-CM | POA: Diagnosis present

## 2014-12-30 DIAGNOSIS — Z319 Encounter for procreative management, unspecified: Secondary | ICD-10-CM

## 2014-12-30 DIAGNOSIS — N930 Postcoital and contact bleeding: Secondary | ICD-10-CM

## 2014-12-30 DIAGNOSIS — Z1151 Encounter for screening for human papillomavirus (HPV): Secondary | ICD-10-CM | POA: Insufficient documentation

## 2014-12-30 HISTORY — DX: Postcoital and contact bleeding: N93.0

## 2014-12-30 NOTE — Progress Notes (Signed)
Patient ID: Rachel Higgins, female   DOB: 1990-10-19, 24 y.o.   MRN: [id number] History of Present Illness: History of Present Illness: Rachel SpruceMeghan is a 24 year old white female,engaged in for a well woman gyn exam and pap.   Current Medications, Allergies, Past Medical History, Past Surgical History, Family History and Social History were reviewed in [facility] electronic medical record.     Review of Systems: Patient denies any headaches, hearing loss, fatigue, blurred vision, shortness of breath, chest pain, abdominal pain, problems with bowel movements, urination, or intercourse. No joint pain or mood swings.Has spotted after sex x 1.    Physical Exam:BP 120/80 mmHg  Pulse 80  Ht 5\' 3"  (1.6 m)  Wt 203 lb (92.08 kg)  BMI 35.97 kg/m2  LMP 12/14/2014 General:  Well developed, well nourished, no acute distress Skin:  Warm and dry Neck:  Midline trachea, normal thyroid, good ROM, no lymphadenopathy Lungs; Clear to auscultation bilaterally Breast:  No dominant palpable mass, retraction, or nipple discharge Cardiovascular: Regular rate and rhythm Abdomen:  Soft, non tender, no hepatosplenomegaly Pelvic:  External genitalia is normal in appearance, no lesions.  The vagina is normal in appearance. Urethra has no lesions or masses. The cervix is everted at the os and has a nabothian cyst at 9 o'clock.Pap performed.  Uterus is felt to be normal size, shape, and contour.  No adnexal masses or tenderness noted.Bladder is non tender, no masses felt. Extremities/musculoskeletal:  No swelling or varicosities noted, no clubbing or cyanosis Psych:  No mood changes, alert and cooperative,seems happy   Impression: Well woman gyn exam with pap Postcoital bleeding Desires pregnancy   Plan: Check progesterone level 11/4 Take prenatal vitamin with folic acid Physical in 1 year

## 2014-12-30 NOTE — Patient Instructions (Signed)
Progesterone level 11/4 Physical with in 1 year

## 2015-01-01 LAB — CYTOLOGY - PAP

## 2015-01-04 LAB — PROGESTERONE: PROGESTERONE: 5.3 ng/mL

## 2015-01-06 ENCOUNTER — Telehealth: Payer: Self-pay | Admitting: Adult Health

## 2015-01-06 NOTE — Telephone Encounter (Signed)
Pt aware of progesterone level and pap

## 2015-03-25 ENCOUNTER — Encounter: Payer: Self-pay | Admitting: Advanced Practice Midwife

## 2015-03-25 ENCOUNTER — Ambulatory Visit (INDEPENDENT_AMBULATORY_CARE_PROVIDER_SITE_OTHER): Payer: BLUE CROSS/BLUE SHIELD | Admitting: Advanced Practice Midwife

## 2015-03-25 VITALS — BP 132/70 | HR 111 | Ht 63.0 in | Wt 208.4 lb

## 2015-03-25 DIAGNOSIS — R102 Pelvic and perineal pain: Secondary | ICD-10-CM | POA: Diagnosis not present

## 2015-03-25 DIAGNOSIS — R319 Hematuria, unspecified: Secondary | ICD-10-CM | POA: Diagnosis not present

## 2015-03-25 DIAGNOSIS — Z3202 Encounter for pregnancy test, result negative: Secondary | ICD-10-CM

## 2015-03-25 LAB — POCT URINALYSIS DIPSTICK
Blood, UA: 4
Glucose, UA: NEGATIVE
Ketones, UA: NEGATIVE
LEUKOCYTES UA: NEGATIVE
Nitrite, UA: NEGATIVE
PROTEIN UA: NEGATIVE

## 2015-03-25 LAB — POCT URINE PREGNANCY: PREG TEST UR: NEGATIVE

## 2015-03-25 MED ORDER — CIPROFLOXACIN HCL 500 MG PO TABS: 500.0000 mg | ORAL_TABLET | Freq: Two times a day (BID) | ORAL | Status: DC

## 2015-03-25 NOTE — Progress Notes (Signed)
[facility] Clinic Visit  Patient name: Rachel Higgins MRN [id number]  Date of birth: 1990-07-26  CC & HPI:  Carylon Tamburro is a 25 y.o. Caucasian female presenting today for C/O lower pelvic pain since Friday.  She states that after having sex, she started having "severe pain" where " I couldn't walk, stand up, pee."  Eventually, she went to sleep.  Pain has persisted to some degree, but now is mainly at end of voiding.  Not on [facility] for about 18 months, "sort of " trying to get pregnant.   Pertinent History Reviewed:  Medical & Surgical Hx:   Past Medical History  Diagnosis Date  . Contraceptive management 09/26/2012  . Thyroid nodule   . Painful urination 07/03/2013  . Unspecified symptom associated with female genital organs 07/03/2013  . Right ovarian cyst 07/13/2013  . RUQ pain 07/20/2013  . Tired 12/26/2013  . Postcoital bleeding 12/30/2014   Past Surgical History  Procedure Laterality Date  . Wisdom tooth extraction    . Cholecystectomy N/A 09/03/2013    Procedure: LAPAROSCOPIC CHOLECYSTECTOMY;  Surgeon: Dalia Heading, MD;  Location: [facility];  Service: General;  Laterality: N/A;   Family History  Problem Relation Age of Onset  . Vision loss Mother   . Thyroid disease Maternal Aunt   . Cancer Maternal Grandfather     thyroid   . Cancer Paternal Grandmother     breast, lung   . Stroke Paternal Grandmother   . Arthritis Paternal Grandmother   . COPD Paternal Grandmother   . Heart disease Paternal Grandmother   . Diabetes Paternal Grandfather     Current outpatient prescriptions:  .  ciprofloxacin (CIPRO) 500 MG tablet, Take 1 tablet (500 mg total) by mouth 2 (two) times daily., Disp: 14 tablet, Rfl: 0 Social History: Reviewed -  reports that she has been smoking Cigarettes.  She has a 2 pack-year smoking history. She has never used smokeless tobacco.  Review of Systems:   Constitutional: Negative for fever and chills Eyes: Negative for visual disturbances Respiratory:  Negative for shortness of breath, dyspnea Cardiovascular: Negative for chest pain or palpitations  Gastrointestinal: Negative for vomiting, diarrhea and constipation; Genitourinary: Negative for urgency, vaginal irritation or itching Musculoskeletal: Negative for back pain, joint pain, myalgias  Neurological: Negative for dizziness and headaches    Objective Findings:    Physical Examination: General appearance - well appearing, and in no distress Mental status - alert, oriented to person, place, and time Chest:  Normal respiratory effort Heart - normal rate and regular rhythm Abdomen:  Soft, nontender Pelvic: SSE:  Normal appearing DC, no odor.  Wet prep has a few WBC's, no trich, clue or yeast.  Sl tender to bimanual, neg CMT Musculoskeletal:  Normal range of motion without pain Extremities:  No edema    Results for orders placed or performed in visit on 03/25/15 (from the past 24 hour(s))  POCT urinalysis dipstick   Collection Time: 03/25/15  9:00 AM  Result Value Ref Range   Color, UA yellow    Clarity, UA clear    Glucose, UA neg    Bilirubin, UA     Ketones, UA neg    Spec Grav, UA     Blood, UA 4    pH, UA     Protein, UA neg    Urobilinogen, UA     Nitrite, UA neg    Leukocytes, UA Negative Negative  POCT urine pregnancy   Collection Time: 03/25/15  9:03 AM  Result Value Ref Range   Preg Test, Ur Negative Negative      Assessment & Plan:  A:   Suspected UTI, possibly pelvic infection P:  Rx Cipro  BID X7 , should cover either.    Return if symptoms worsen or fail to improve.  CRESENZO-DISHMAN,Arthella Headings CNM 03/25/2015 2:41 PM

## 2015-03-27 LAB — URINE CULTURE

## 2015-09-12 ENCOUNTER — Telehealth: Payer: Self-pay | Admitting: *Deleted

## 2015-09-12 NOTE — Telephone Encounter (Signed)
Having some cramps and has had diarrhea, feels swollen on left side, to see PCP first

## 2015-09-17 DIAGNOSIS — L659 Nonscarring hair loss, unspecified: Secondary | ICD-10-CM | POA: Diagnosis not present

## 2015-09-17 DIAGNOSIS — R197 Diarrhea, unspecified: Secondary | ICD-10-CM | POA: Diagnosis not present

## 2015-09-17 DIAGNOSIS — R1013 Epigastric pain: Secondary | ICD-10-CM | POA: Diagnosis not present

## 2015-09-17 DIAGNOSIS — Z1389 Encounter for screening for other disorder: Secondary | ICD-10-CM | POA: Diagnosis not present

## 2015-09-17 DIAGNOSIS — G43909 Migraine, unspecified, not intractable, without status migrainosus: Secondary | ICD-10-CM | POA: Diagnosis not present

## 2015-09-17 DIAGNOSIS — E041 Nontoxic single thyroid nodule: Secondary | ICD-10-CM | POA: Diagnosis not present

## 2015-09-29 ENCOUNTER — Telehealth: Payer: Self-pay | Admitting: Internal Medicine

## 2015-09-29 ENCOUNTER — Encounter: Payer: Self-pay | Admitting: Internal Medicine

## 2015-09-29 NOTE — Telephone Encounter (Signed)
Pt has been having diarrhea, she reports 3-4 times/day. Pt states she has not been on any antibiotics recently. Her PCP had her try probiotics and she has been trying Imodium but reports this is not working. Pt has not been eating dairy. Pt scheduled to see Rachel Meeker PA 10/01/15@3 :15pm. Pt aware of appt.

## 2015-10-01 ENCOUNTER — Ambulatory Visit (INDEPENDENT_AMBULATORY_CARE_PROVIDER_SITE_OTHER): Payer: BLUE CROSS/BLUE SHIELD | Admitting: Physician Assistant

## 2015-10-01 ENCOUNTER — Encounter: Payer: Self-pay | Admitting: Physician Assistant

## 2015-10-01 ENCOUNTER — Encounter (INDEPENDENT_AMBULATORY_CARE_PROVIDER_SITE_OTHER): Payer: Self-pay

## 2015-10-01 VITALS — BP 110/76 | HR 100 | Ht 63.0 in | Wt 212.0 lb

## 2015-10-01 DIAGNOSIS — R197 Diarrhea, unspecified: Secondary | ICD-10-CM

## 2015-10-01 DIAGNOSIS — R110 Nausea: Secondary | ICD-10-CM

## 2015-10-01 DIAGNOSIS — R1013 Epigastric pain: Secondary | ICD-10-CM | POA: Diagnosis not present

## 2015-10-01 MED ORDER — OMEPRAZOLE 20 MG PO CPDR: 20.0000 mg | DELAYED_RELEASE_CAPSULE | Freq: Every day | ORAL | 2 refills | Status: DC

## 2015-10-01 NOTE — Patient Instructions (Addendum)
  Your physician has requested that you go to the basement for the following lab work before leaving today: Stool studies   We have sent the following medications to your pharmacy for you to pick up at your convenience: Omeprazole   Start a high fiber diet 25-35 grams per day.   Follow up with Rachel Meeker PA-C in 3-4 weeks.   I appreciate the opportunity to care for you.

## 2015-10-01 NOTE — Progress Notes (Signed)
Chief Complaint: Diarrhea  HPI:  Rachel Higgins is a 25 year old Caucasian female who presents to clinic today, as a referral from Dr. Evlyn Kanner, for a complaint of diarrhea. Today, the patient tells me that she has had an increase in the amount of bowel movements she has per day over the past month. She describes that she will have 3-4 loose bowel movements per day, typically after eating. The patient notes that this will occur 10-15 minutes after eating and is associated with some nausea. She also describes some epigastric discomfort and bloating. The patient does feel some left lower quadrant tightness/ "crampiness", but this is relieved after a bowel movement. The patient did see her primary care provider regarding this and was told to try a probiotic and Imodium. She tells me that she has been on a generic probiotic over the past 2 weeks, but has noticed no change in her bowel habits. She states that she was "too afraid" to start Imodium as she didn't want to "stop things up and completely stop going".  Patient also admits to a history of using intermittent over-the-counter Omeprazole for 14 days at a time, she most recently did this a month ago, but has taken a total of 3-4 treatments over the past few months. This does seem to help her symptoms of epigastric discomfort and heartburn, reflux and nausea.  Surgical history is positive for a cholecystectomy 2 years ago, patient tells me that she never had a lot of diarrhea after this procedure.  Patient denies fever, chills, blood in her stool, melena, change in diet, recent change in medications, recent travel, sick contacts, weight loss, fatigue, vomiting or symptoms that awaken her at night.   Past Medical History:  Diagnosis Date  . Broken arm 1996  . Contraceptive management 09/26/2012  . Migraine headache   . Painful urination 07/03/2013  . Postcoital bleeding 12/30/2014  . Right ovarian cyst 07/13/2013  . RUQ pain 07/20/2013  . Thyroid nodule     . Tired 12/26/2013  . Unspecified symptom associated with female genital organs 07/03/2013    Past Surgical History:  Procedure Laterality Date  . CHOLECYSTECTOMY N/A 09/03/2013   Procedure: LAPAROSCOPIC CHOLECYSTECTOMY;  Surgeon: Dalia Heading, MD;  Location: [facility];  Service: General;  Laterality: N/A;  . WISDOM TOOTH EXTRACTION      Current Outpatient Prescriptions  Medication Sig Dispense Refill  . Probiotic Product (PROBIOTIC-10) CAPS Take 1 capsule by mouth daily.     No current facility-administered medications for this visit.     Allergies as of 10/01/2015 - Review Complete 10/01/2015  Allergen Reaction Noted  . Adhesive [tape]  08/17/2013    Family History  Problem Relation Age of Onset  . Vision loss Mother   . Thyroid disease Maternal Aunt   . Cancer Maternal Grandfather     thyroid   . Cancer Paternal Grandmother     breast, lung   . Stroke Paternal Grandmother   . Arthritis Paternal Grandmother   . COPD Paternal Grandmother   . Heart disease Paternal Grandmother   . Diabetes Paternal Grandfather   . Hyperlipidemia Father   . Arthritis Father     Social History   Social History  . Marital status: Single    Spouse name: N/A  . Number of children: N/A  . Years of education: N/A   Occupational History  . Not on file.   Social History Main Topics  . Smoking status: Current Every Day Smoker  Packs/day: 0.50    Years: 4.00    Types: Cigarettes  . Smokeless tobacco: Never Used  . Alcohol use No  . Drug use: No  . Sexual activity: Yes    Birth control/ protection: None, Condom   Other Topics Concern  . Not on file   Social History Narrative  . No narrative on file    Review of Systems:    Constitutional: No weight loss, fever, chills, weakness or fatigue HEENT: Eyes: No Change in vision               Ears, Nose, Throat:  No change in hearing Skin: No rash or itching Cardiovascular: No chest pain, chest pressure or palpitations     Respiratory: No SOB or cough Gastrointestinal: See HPI and otherwise negative Genitourinary: No dysuria or change in urinary frequency Neurological: No headache, dizziness or syncope Musculoskeletal: No new muscle or joint pain Hematologic: No bleeding or bruising Psychiatric: No anxiety or depression   Physical Exam:  Vital signs: BP 110/76 (BP Location: Left Arm, Patient Position: Sitting, Cuff Size: Normal)   Pulse 100   Ht 5\' 3"  (1.6 m)   Wt 212 lb (96.2 kg)   LMP 09/25/2015 (Approximate)   BMI 37.55 kg/m   General:   Pleasant Overweight Caucasian female appears to be in NAD, Well developed, Well nourished, alert and cooperative Head:  Normocephalic and atraumatic. Eyes:   PEERL, EOMI. No icterus. Conjunctiva pink. Ears:  Normal auditory acuity. Neck:  Supple Throat: Oral cavity and pharynx without inflammation, swelling or lesion. Teeth in good condition. Lungs: Respirations even and unlabored. Lungs clear to auscultation bilaterally.   No wheezes, crackles, or rhonchi.  Heart: Normal S1, S2. No MRG. Regular rate and rhythm. No peripheral edema, cyanosis or pallor.  Abdomen:  Soft, mild distention, generalized mild abdominal tenderness, somewhat worse in the left lower quadrant and epigastric No rebound or guarding. Normal bowel sounds. No appreciable masses or hepatomegaly. Rectal:  Not performed.  Msk:  Symmetrical without gross deformities. Peripheral pulses intact.  Extremities:  Without edema, no deformity or joint abnormality. Neurologic:  Alert and  oriented x4;  grossly normal neurologically.   Skin:   Dry and intact without significant lesions or rashes. Psychiatric: Oriented to person, place and time. Demonstrates good judgement and reason without abnormal affect or behaviors.  RECENT LABS: Patient reports recent labs are done by her primary care provider Dr. Evlyn Kanner  Assessment: 1. Diarrhea: Patient reports 1 month of 3-4 loose bowel movements per day, worse  after eating, no help from over-the-counter probiotic for the past 2 weeks, no previous stool studies, history of cholecystectomy 2 years ago; consider infectious versus IBS versus postcholecystectomy diarrhea 2. Epigastric pain: 15 minutes after eating, patient has used over-the-counter omeprazole in the past to help with this, recent increase in frequency; consider gastritis versus bile reflux versus reflux versus other 3. Nausea: Patient reports nausea 15 minutes after eating, somewhat increased recently consider relation to above  Plan: 1. Ordered stool studies to include GI pathogen panel and ova and parasites 2. Recommend the patient start a high-fiber diet, 25-35 g per day with the use of fiber suplement such as Metamucil, Citrucel or Benefiber or a fiber one bar or fiber gummies. Also recommend the patient increase water intake to at least 6-8 8 ounce glasses per day. 3. If stool studies returned negative patient may use Imodium when necessary 4. Prescribed omeprazole 20 mg once daily, 30-60 minutes before eating. We'll continue this  for the next month and reevaluate at follow-up. 5. Request recent labs from Dr. Rinaldo Cloud office 6. Patient to follow in clinic in 3-4 weeks with me, she does have upcoming new patient appointment with Dr. Marina Goodell scheduled for late September.  Hyacinth Meeker, PA-C [facility] 10/01/2015, 3:56 PM  Cc: Dr. Evlyn Kanner

## 2015-10-02 NOTE — Progress Notes (Signed)
Agree with initial assessment and plans. 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 She should keep her follow-up with me in September. Thanks

## 2015-10-06 ENCOUNTER — Other Ambulatory Visit: Payer: BLUE CROSS/BLUE SHIELD

## 2015-10-06 DIAGNOSIS — R197 Diarrhea, unspecified: Secondary | ICD-10-CM

## 2015-10-06 DIAGNOSIS — R110 Nausea: Secondary | ICD-10-CM

## 2015-10-06 DIAGNOSIS — R1013 Epigastric pain: Secondary | ICD-10-CM | POA: Diagnosis not present

## 2015-10-07 DIAGNOSIS — R635 Abnormal weight gain: Secondary | ICD-10-CM | POA: Diagnosis not present

## 2015-10-07 LAB — OVA AND PARASITE EXAMINATION: OP: NONE SEEN

## 2015-10-08 LAB — GASTROINTESTINAL PATHOGEN PANEL PCR
C. DIFFICILE TOX A/B, PCR: NOT DETECTED
CAMPYLOBACTER, PCR: NOT DETECTED
Cryptosporidium, PCR: NOT DETECTED
E COLI (ETEC) LT/ST, PCR: NOT DETECTED
E COLI (STEC) STX1/STX2, PCR: NOT DETECTED
E COLI 0157, PCR: NOT DETECTED
Giardia lamblia, PCR: NOT DETECTED
Norovirus, PCR: NOT DETECTED
Rotavirus A, PCR: NOT DETECTED
SALMONELLA, PCR: NOT DETECTED
SHIGELLA, PCR: NOT DETECTED

## 2015-10-10 NOTE — Progress Notes (Signed)
Negative  - see Dr. Marina GoodellPerry as planned

## 2015-10-21 ENCOUNTER — Ambulatory Visit: Payer: BLUE CROSS/BLUE SHIELD | Admitting: Physician Assistant

## 2015-10-22 ENCOUNTER — Encounter: Payer: Self-pay | Admitting: Physician Assistant

## 2015-10-22 ENCOUNTER — Ambulatory Visit (INDEPENDENT_AMBULATORY_CARE_PROVIDER_SITE_OTHER): Payer: BLUE CROSS/BLUE SHIELD | Admitting: Physician Assistant

## 2015-10-22 VITALS — BP 110/60 | HR 100 | Ht 63.0 in | Wt 210.0 lb

## 2015-10-22 DIAGNOSIS — R197 Diarrhea, unspecified: Secondary | ICD-10-CM | POA: Diagnosis not present

## 2015-10-22 DIAGNOSIS — R1013 Epigastric pain: Secondary | ICD-10-CM

## 2015-10-22 DIAGNOSIS — R110 Nausea: Secondary | ICD-10-CM | POA: Diagnosis not present

## 2015-10-22 MED ORDER — OMEPRAZOLE 40 MG PO CPDR: 40.0000 mg | DELAYED_RELEASE_CAPSULE | Freq: Every day | ORAL | 3 refills | Status: DC

## 2015-10-22 NOTE — Progress Notes (Signed)
Agree with initial assessment and plans as outlined 

## 2015-10-22 NOTE — Patient Instructions (Addendum)
You have been given a separate informational sheet regarding your tobacco use, the importance of quitting and local resources to help you quit. We sent a prescription to [facility] for the Omeprazole 40 mg.  Take 1 tablet daily. You can use the Omeprazole 20 mg, and take 2 tablets until your out.   We have given you a FODMAP diet.                 If you are age 27 or younger, your body mass index should be between 19-25. Your Body mass index is 37.2 kg/m. If this is out of the aformentioned range listed, please consider follow up with your Primary Care Provider.

## 2015-10-22 NOTE — Progress Notes (Signed)
Chief Complaint: Diarrhea  HPI:   Ms. Rachel Higgins is a 25 year old Caucasian female who initially presented to the clinic on 10/01/15 with a complaint of diarrhea.  At time of last visit, patient described an increase in bowel movements over the past month describing 3-4 loose bowel movement per day about 10-15 minutes after eating. These were associated with some abdominal cramping which is relieved afterwards. Patient also described some epigastric discomfort as well as heartburn and reflux. At that time it was discussed that this could represent postinfectious vs infectious IBS. Patient was started on a high fiber diet as well as omeprazole. A GI pathogen panel as well as O and P were ordered and returned negative. Patient was instructed to use Imodium when necessary for continued diarrhea.  Today, the patient explains that she has been using a daily probiotic and has also increased her fiber intake. She has also continued to use her Omeprazole 20 mg once daily. Patient tells me that she has no further abdominal pain or left lower quadrant cramping. She does continue with diarrhea typically 10-15 minutes after eating meals and does have some nausea as well as bloating at these times. Overall she feels somewhat better, but feels that she eats healthy meals and does not understand why this could be causing her diarrhea.  Patient denies fever, chills, blood in her stool, melena, weight loss, fatigue, anorexia, vomiting or continued abdominal pain.   Past Medical History:  Diagnosis Date  . Broken arm 1996  . Contraceptive management 09/26/2012  . Migraine headache   . Painful urination 07/03/2013  . Postcoital bleeding 12/30/2014  . Right ovarian cyst 07/13/2013  . RUQ pain 07/20/2013  . Thyroid nodule   . Tired 12/26/2013  . Unspecified symptom associated with female genital organs 07/03/2013    Past Surgical History:  Procedure Laterality Date  . CHOLECYSTECTOMY N/A 09/03/2013   Procedure:  LAPAROSCOPIC CHOLECYSTECTOMY;  Surgeon: Dalia HeadingMark A Jenkins, MD;  Location: [facility];  Service: General;  Laterality: N/A;  . WISDOM TOOTH EXTRACTION      Current Outpatient Prescriptions  Medication Sig Dispense Refill  . omeprazole (PRILOSEC) 20 MG capsule Take 1 capsule (20 mg total) by mouth daily. 30 capsule 2  . Probiotic Product (PROBIOTIC-10) CAPS Take 1 capsule by mouth daily.     No current facility-administered medications for this visit.     Allergies as of 10/22/2015 - Review Complete 10/01/2015  Allergen Reaction Noted  . Adhesive [tape]  08/17/2013    Family History  Problem Relation Age of Onset  . Vision loss Mother   . Thyroid disease Maternal Aunt   . Cancer Maternal Grandfather     thyroid   . Cancer Paternal Grandmother     breast, lung   . Stroke Paternal Grandmother   . Arthritis Paternal Grandmother   . COPD Paternal Grandmother   . Heart disease Paternal Grandmother   . Diabetes Paternal Grandfather   . Hyperlipidemia Father   . Arthritis Father     Social History   Social History  . Marital status: Single    Spouse name: N/A  . Number of children: N/A  . Years of education: N/A   Occupational History  . Not on file.   Social History Main Topics  . Smoking status: Current Every Day Smoker    Packs/day: 0.50    Years: 4.00    Types: Cigarettes  . Smokeless tobacco: Never Used  . Alcohol use No  . Drug use: No  .  Sexual activity: Yes    Birth control/ protection: None, Condom   Other Topics Concern  . Not on file   Social History Narrative  . No narrative on file    Review of Systems:    Constitutional: No weight loss, Fever or chills HEENT: Eyes: No change in vision               Ears, Nose, Throat:  No change in hearing Skin: No rash or itching Cardiovascular: No chest pain, chest pressure or palpitations       Respiratory: No SOB or cough Gastrointestinal: See HPI and otherwise negative Genitourinary: No dysuria or change in  urinary frequency Neurological: No headache, dizziness or syncope Musculoskeletal: No new muscle or back pain Psychiatric: No history of depression or anxiety Allergies: No history of asthma, hives or eczema    Physical Exam:  Vital signs: Wt 210 lb (95.3 kg)   LMP 09/25/2015 (Approximate)   BMI 37.20 kg/m   General:   PleasantOverweight Caucasian female appears to be in NAD, Well developed, Well nourished, alert and cooperative Head:  Normocephalic and atraumatic. Eyes:   PEERL, EOMI. No icterus. Conjunctiva pink. Ears:  Normal auditory acuity. Neck:  Supple Throat: Oral cavity and pharynx without inflammation, swelling or lesion. Teeth in good condition. Lungs: Respirations even and unlabored. Lungs clear to auscultation bilaterally.   No wheezes, crackles, or rhonchi.  Heart: Normal S1, S2. No MRG. Regular rate and rhythm. No peripheral edema, cyanosis or pallor.  Abdomen:  Soft, nondistended, nontender. No rebound or guarding. Normal bowel sounds. No appreciable masses or hepatomegaly. Rectal:  Not performed.  Msk:  Symmetrical without gross deformities. Peripheral pulses intact.  Extremities:  Without edema, no deformity or joint abnormality. Normal ROM. Neurologic:  Alert and  oriented x4;  grossly normal neurologically.  Skin:   Dry and intact without significant lesions or rashes. Psychiatric: Oriented to person, place and time. Demonstrates good judgement and reason without abnormal affect or behaviors.  Relevant labs:  2wk ago  OP No Ova or Parasites Seen     2wk ago  Campylobacter, PCR Not Detected  C. difficile Tox A/B, PCR Not Detected  E coli 0157, PCR Not Detected  E coli (ETEC) LT/ST PCR Not Detected  E coli (STEC) stx1/stx2, PCR Not Detected  Salmonella, PCR Not Detected  Shigella, PCR Not Detected  Norovirus, PCR Not Detected  Rotavirus A, PCR Not Detected  Giardia lamblia, PCR Not Detected  Cryptosporidium, PCR Not Detected      Assessment: 1.Diarrhea: Patient continues with 3 diarrheal episodes per day 10-15 minutes after eating, regardless of high fiber diet and probiotic over the past month, no further abdominal pain associated with this,  continued to believe this represents IBS 2. Epigastric pain: Relieved with use of omeprazole 20 mg daily 3. Nausea: Continues along with bloating typically after eating  Plan: 1. Increase Omeprazole to 40 mg daily, 30-60 minutes before eating. If increase does not help with nausea, could discuss EGD for further evaluation in the future. 2. Patient given information regarding the low FODMAP diet, we did discuss that she should continue this for 6 weeks and then slowly add foods back in. She should also continue her high fiber diet and probiotic. If this does not help with diarrhea, could consider colonoscopy for further evaluation. 3. Could also discuss addition of Bentyl or hyoscyamine in the future, though patient is nervous about becoming constipated and would "rather have diarrhea than not go at all". We  will hold on this at this time. 4. Patient has scheduled follow-up with Dr. Marina GoodellPerry in 1 month.  Hyacinth MeekerJennifer Lemmon, PA-C [facility] 10/22/2015, 9:47 AM

## 2015-11-25 ENCOUNTER — Ambulatory Visit (INDEPENDENT_AMBULATORY_CARE_PROVIDER_SITE_OTHER): Payer: BLUE CROSS/BLUE SHIELD | Admitting: Internal Medicine

## 2015-11-25 ENCOUNTER — Encounter: Payer: Self-pay | Admitting: Internal Medicine

## 2015-11-25 ENCOUNTER — Encounter (INDEPENDENT_AMBULATORY_CARE_PROVIDER_SITE_OTHER): Payer: Self-pay

## 2015-11-25 VITALS — BP 110/80 | HR 72 | Ht 63.0 in | Wt 208.0 lb

## 2015-11-25 DIAGNOSIS — R110 Nausea: Secondary | ICD-10-CM

## 2015-11-25 DIAGNOSIS — R197 Diarrhea, unspecified: Secondary | ICD-10-CM

## 2015-11-25 DIAGNOSIS — K589 Irritable bowel syndrome without diarrhea: Secondary | ICD-10-CM

## 2015-11-25 NOTE — Progress Notes (Signed)
HISTORY OF PRESENT ILLNESS:  Rachel Higgins is a 25 y.o. female who was evaluated by the GI physician assistant 10/22/2015 and 10/01/2015 for problems with diarrhea, abdominal cramping, nausea which seemingly began in May. She is status post cholecystectomy 3 years prior. No problems with bowels thereafter. Stool studies were negative. Routine laboratories remarkable for mild elevation of ALT. She is significantly obese. Last time she was here treated with dietary measures FODMAP and PPI for GERD. Presents today for follow-up. States that her GERD is significantly better. Still with nausea. Doesn't think she is pregnant. Diarrhea has improved as well. Not normal yet. No new complaints  REVIEW OF SYSTEMS:  All non-GI ROS negative except for anxiety, headaches  Past Medical History:  Diagnosis Date  . Broken arm 1996  . Contraceptive management 09/26/2012  . Migraine headache   . Painful urination 07/03/2013  . Postcoital bleeding 12/30/2014  . Right ovarian cyst 07/13/2013  . RUQ pain 07/20/2013  . Thyroid nodule   . Tired 12/26/2013  . Unspecified symptom associated with female genital organs 07/03/2013    Past Surgical History:  Procedure Laterality Date  . CHOLECYSTECTOMY N/A 09/03/2013   Procedure: LAPAROSCOPIC CHOLECYSTECTOMY;  Surgeon: Dalia HeadingMark A Jenkins, MD;  Location: [facility];  Service: General;  Laterality: N/A;  . WISDOM TOOTH EXTRACTION      Social History Rachel Higgins  reports that she has been smoking Cigarettes.  She has a 2.00 pack-year smoking history. She has never used smokeless tobacco. She reports that she does not drink alcohol or use drugs.  family history includes Arthritis in her father and paternal grandmother; Breast cancer in her paternal grandmother; COPD in her paternal grandmother; Diabetes in her paternal grandfather; Heart disease in her paternal grandmother; Hyperlipidemia in her father; Lung cancer in her paternal grandmother; Stroke in her paternal grandmother; Thyroid  cancer in her maternal grandfather; Thyroid disease in her maternal aunt; Vision loss in her mother.  Allergies  Allergen Reactions  . Adhesive [Tape]     Rash and burns skin        PHYSICAL EXAMINATION: Vital signs: BP 110/80   Pulse 72   Ht 5\' 3"  (1.6 m)   Wt 208 lb (94.3 kg)   LMP 10/28/2015 Comment: only 2 days long  BMI 36.85 kg/m   Constitutional: Doesn't, obese, generally well-appearing, no acute distress Psychiatric: alert and oriented x3, cooperative Eyes: extraocular movements intact, anicteric, conjunctiva pink Mouth: oral pharynx moist, no lesions Neck: supple no lymphadenopathy Cardiovascular: heart regular rate and rhythm, no murmur Lungs: clear to auscultation bilaterally Abdomen: soft, obese, nontender, nondistended, no obvious ascites, no peritoneal signs, normal bowel sounds, no organomegaly Rectal: Omitted Extremities: no clubbing cyanosis or lower extremity edema bilaterally Skin: no lesions on visible extremities Neuro: No focal deficits.   ASSESSMENT:  #1. Relatively recent problems with loose stools and abdominal discomfort. Trend has been toward improvement. Favor postinfectious IBS as diagnosis. #2. Nausea. 2 months. Rule out pregnancy #3. GERD. Improved on PPI   PLAN:  #1. Reflux precautions with attention to weight loss #2. Continue PPI for control GERD symptoms #3. Patient will obtain pregnancy test if she is late for her next menstrual period, which she anticipates in a few days. #4. I told her that as long as her problems continue to trend toward improvement then ongoing expectant management. However, if problems persist or worsen then she should re-present for more in depth evaluation including celiac testing etc. She agrees

## 2015-11-25 NOTE — Patient Instructions (Signed)
Please follow up as needed 

## 2015-12-02 ENCOUNTER — Ambulatory Visit (INDEPENDENT_AMBULATORY_CARE_PROVIDER_SITE_OTHER): Payer: BLUE CROSS/BLUE SHIELD | Admitting: Adult Health

## 2015-12-02 ENCOUNTER — Encounter: Payer: Self-pay | Admitting: Adult Health

## 2015-12-02 VITALS — BP 126/62 | HR 102 | Ht 63.0 in | Wt 208.5 lb

## 2015-12-02 DIAGNOSIS — N926 Irregular menstruation, unspecified: Secondary | ICD-10-CM

## 2015-12-02 DIAGNOSIS — Z3202 Encounter for pregnancy test, result negative: Secondary | ICD-10-CM | POA: Diagnosis not present

## 2015-12-02 DIAGNOSIS — Z319 Encounter for procreative management, unspecified: Secondary | ICD-10-CM

## 2015-12-02 LAB — POCT URINE PREGNANCY: Preg Test, Ur: NEGATIVE

## 2015-12-02 NOTE — Patient Instructions (Signed)
Call with or without a period

## 2015-12-02 NOTE — Progress Notes (Signed)
Subjective:     Patient ID: Rachel Higgins, female   DOB: [birth year], 25 y.o.   MRN: [id number]  HPI Rachel Higgins is a 25 year old white female in for UPT, has had 1+HPT and 1 negative HPT,She wants to be pregnant.  Review of Systems +missed period Reviewed past medical,surgical, social and family history. Reviewed medications and allergies.     Objective:   Physical Exam BP 126/62 (BP Location: Left Arm, Patient Position: Sitting, Cuff Size: Large)   Pulse (!) 102   Ht 5\' 3"  (1.6 m)   Wt 208 lb 8 oz (94.6 kg)   LMP 10/28/2015 Comment: only 2 days long  BMI 36.93 kg/m   UPT negative today. Skin warm and dry. Lungs: clear to ausculation bilaterally. Cardiovascular: regular rate and rhythm.Discussed if not ovulating may need clomid. PHQ 2 score 0.    Assessment:     1. Missed period   2. Pregnancy examination or test, negative result   3. Patient desires pregnancy       Plan:    If no period by end of week, [facility] or Call with period, will get progesterone level Has appt 11/2

## 2015-12-08 ENCOUNTER — Telehealth: Payer: Self-pay | Admitting: Adult Health

## 2015-12-08 DIAGNOSIS — Z319 Encounter for procreative management, unspecified: Secondary | ICD-10-CM

## 2015-12-08 NOTE — Telephone Encounter (Signed)
Period started October 7 will check progesterone level 10/27

## 2015-12-26 DIAGNOSIS — Z319 Encounter for procreative management, unspecified: Secondary | ICD-10-CM | POA: Diagnosis not present

## 2015-12-27 LAB — PROGESTERONE: PROGESTERONE: 8 ng/mL

## 2016-01-01 ENCOUNTER — Ambulatory Visit (INDEPENDENT_AMBULATORY_CARE_PROVIDER_SITE_OTHER): Payer: BLUE CROSS/BLUE SHIELD | Admitting: Adult Health

## 2016-01-01 ENCOUNTER — Other Ambulatory Visit: Payer: Self-pay | Admitting: Adult Health

## 2016-01-01 ENCOUNTER — Encounter: Payer: Self-pay | Admitting: Adult Health

## 2016-01-01 VITALS — BP 116/72 | HR 98 | Ht 62.5 in | Wt 209.0 lb

## 2016-01-01 DIAGNOSIS — Z01419 Encounter for gynecological examination (general) (routine) without abnormal findings: Secondary | ICD-10-CM

## 2016-01-01 DIAGNOSIS — Z319 Encounter for procreative management, unspecified: Secondary | ICD-10-CM

## 2016-01-01 MED ORDER — CLOMIPHENE CITRATE 50 MG PO TABS: ORAL_TABLET | ORAL | 2 refills | Status: DC

## 2016-01-01 NOTE — Patient Instructions (Addendum)
Physical in 1 year Pap 2019 Have regular sex CLOMID INSTRUCTIONS  WHY USE IT? Clomid helps your ovaries to release eggs (ovulate).  HOW TO USE IT? Clomid is taken as a pill usually on days 5,6,7,8, & 9 of your cycle.  Day 1 is the first day of your period. The dose or duration may be changed to achieve ovulation.  Provera (progesterone) may first be used to bring on a period for some patients. The day of ovulation on Clomid is usually between cycle day 14 and 17.  Having sexual intercourse at least every other day between cycle day 13 and 18 will improve your chances of becoming pregnant during the Clomid cycle.  You may monitor your ovulation using basal body temperature charts or with ovulation kits.  If using the ovulation predictor kits, having intercourse the day of the surge and the two days following is recommended. If you get your period, call when it starts for an appointment with your doctor, so that an exam may be done, and another Clomid cycle can be considered if appropriate. If you do not get a period by day 35 of the cycle, please get a blood pregnancy test.  If it is negative, speak to your doctor for instructions to bring on another period and to plan a follow-up appointment.  THINGS TO KNOW: If you get pregnant while using Clomid, your chance of twins is 7%m and triplets is less than 1%. Some studies have suggested the use of "fertility drugs" may increase your risk of ovarian cancers in the future.  It is unclear if these drugs increase the risk, or people who have problems with fertility are prone for these cancers.  If there is an actual risk, it is very low.  If you have a history of liver problems or ovarian cancer, it may be wise to avoid this medication.  SIDE EFFECTS:  The most common side effect is hot flashes (20%).  Breast tenderness, headaches, nausea, bloating may also occur at different times.  Less than 3/1,000 people have dryness or loss of hair.  Persistent  ovarian cysts may form from the use of this medication.  Ovarian hyperstimulation syndrome is a rare side effect at low doses.  Visual changes like flashes of light or blurring.

## 2016-01-01 NOTE — Progress Notes (Signed)
Patient ID: Rachel Higgins, female   DOB: 17-Jan-1991, 25 y.o.   MRN: [id number] History of Present Illness: Rachel Higgins is a 25 year old white female,married in for well woman gyn exam, she had a normal pap with negative HPV 12/30/14.She wants to be pregnant, last progesterone level 8.She has tried now for about 2 years.   Current Medications, Allergies, Past Medical History, Past Surgical History, Family History and Social History were reviewed in [facility] electronic medical record.     Review of Systems: Patient denies any headaches, hearing loss, fatigue, blurred vision, shortness of breath, chest pain, abdominal pain, problems with bowel movements, urination, or intercourse. No joint pain or mood swings.    Physical Exam:BP 116/72 (BP Location: Left Arm, Patient Position: Sitting, Cuff Size: Large)   Pulse 98   Ht 5' 2.5" (1.588 m)   Wt 209 lb (94.8 kg)   LMP 12/05/2015 (Exact Date)   BMI 37.62 kg/m  General:  Well developed, well nourished, no acute distress Skin:  Warm and dry Neck:  Midline trachea, normal thyroid, good ROM, no lymphadenopathy Lungs; Clear to auscultation bilaterally Breast:  No dominant palpable mass, retraction, or nipple discharge Cardiovascular: Regular rate and rhythm Abdomen:  Soft, non tender, no hepatosplenomegaly Pelvic:  External genitalia is normal in appearance, no lesions.  The vagina is normal in appearance. Urethra has no lesions or masses. The cervix is nulliparous .  Uterus is felt to be normal size, shape, and contour.  No adnexal masses or tenderness noted.Bladder is non tender, no masses felt. Extremities/musculoskeletal:  No swelling or varicosities noted, no clubbing or cyanosis Psych:  No mood changes, alert and cooperative,seems happy PHQ 2 score 0  Impression: 1. Well woman exam with routine gynecological exam   2. Patient desires pregnancy       Plan: Physical in 1 year Pap 2019 Have regular sex Review handout on clomid and let  me know, if wants to try.

## 2016-01-19 ENCOUNTER — Encounter: Payer: Self-pay | Admitting: Adult Health

## 2016-01-19 ENCOUNTER — Other Ambulatory Visit: Payer: Self-pay | Admitting: Adult Health

## 2016-01-19 DIAGNOSIS — Z319 Encounter for procreative management, unspecified: Secondary | ICD-10-CM

## 2016-01-23 ENCOUNTER — Other Ambulatory Visit: Payer: Self-pay | Admitting: Adult Health

## 2016-01-23 DIAGNOSIS — Z0000 Encounter for general adult medical examination without abnormal findings: Secondary | ICD-10-CM | POA: Diagnosis not present

## 2016-01-24 LAB — PROGESTERONE: PROGESTERONE: 18.2 ng/mL

## 2016-02-02 ENCOUNTER — Encounter: Payer: Self-pay | Admitting: Adult Health

## 2016-02-02 ENCOUNTER — Other Ambulatory Visit: Payer: Self-pay | Admitting: Adult Health

## 2016-02-02 MED ORDER — CLOMIPHENE CITRATE 50 MG PO TABS: ORAL_TABLET | ORAL | 2 refills | Status: DC

## 2016-04-05 ENCOUNTER — Encounter: Payer: Self-pay | Admitting: Adult Health

## 2016-04-28 ENCOUNTER — Encounter: Payer: Self-pay | Admitting: Adult Health

## 2016-04-29 ENCOUNTER — Other Ambulatory Visit: Payer: Self-pay | Admitting: Adult Health

## 2016-04-29 DIAGNOSIS — Z319 Encounter for procreative management, unspecified: Secondary | ICD-10-CM

## 2016-04-29 MED ORDER — CLOMIPHENE CITRATE 50 MG PO TABS: ORAL_TABLET | ORAL | 2 refills | Status: DC

## 2016-05-18 DIAGNOSIS — Z319 Encounter for procreative management, unspecified: Secondary | ICD-10-CM | POA: Diagnosis not present

## 2016-05-19 LAB — PROGESTERONE: PROGESTERONE: 21.7 ng/mL

## 2016-06-28 ENCOUNTER — Other Ambulatory Visit: Payer: Self-pay | Admitting: Adult Health

## 2016-07-09 ENCOUNTER — Emergency Department (HOSPITAL_COMMUNITY): Payer: BLUE CROSS/BLUE SHIELD

## 2016-07-09 ENCOUNTER — Emergency Department (HOSPITAL_COMMUNITY)
Admission: EM | Admit: 2016-07-09 | Discharge: 2016-07-09 | Disposition: A | Discharge status: Home / Self Care | Payer: BLUE CROSS/BLUE SHIELD | Attending: Emergency Medicine | Admitting: Emergency Medicine

## 2016-07-09 ENCOUNTER — Encounter (HOSPITAL_COMMUNITY): Payer: Self-pay

## 2016-07-09 DIAGNOSIS — Z79899 Other long term (current) drug therapy: Secondary | ICD-10-CM | POA: Insufficient documentation

## 2016-07-09 DIAGNOSIS — F17210 Nicotine dependence, cigarettes, uncomplicated: Secondary | ICD-10-CM | POA: Insufficient documentation

## 2016-07-09 DIAGNOSIS — R1031 Right lower quadrant pain: Secondary | ICD-10-CM | POA: Insufficient documentation

## 2016-07-09 DIAGNOSIS — R109 Unspecified abdominal pain: Secondary | ICD-10-CM | POA: Diagnosis not present

## 2016-07-09 LAB — COMPREHENSIVE METABOLIC PANEL
ALT: 63 U/L — ABNORMAL HIGH (ref 14–54)
ANION GAP: 10 (ref 5–15)
AST: 52 U/L — ABNORMAL HIGH (ref 15–41)
Albumin: 3.7 g/dL (ref 3.5–5.0)
Alkaline Phosphatase: 63 U/L (ref 38–126)
BILIRUBIN TOTAL: 0.4 mg/dL (ref 0.3–1.2)
BUN: 8 mg/dL (ref 6–20)
CO2: 23 mmol/L (ref 22–32)
Calcium: 9.4 mg/dL (ref 8.9–10.3)
Chloride: 105 mmol/L (ref 101–111)
Creatinine, Ser: 0.76 mg/dL (ref 0.44–1.00)
Glucose, Bld: 92 mg/dL (ref 65–99)
POTASSIUM: 4 mmol/L (ref 3.5–5.1)
Sodium: 138 mmol/L (ref 135–145)
TOTAL PROTEIN: 7 g/dL (ref 6.5–8.1)

## 2016-07-09 LAB — CBC
HEMATOCRIT: 41.3 % (ref 36.0–46.0)
HEMOGLOBIN: 13.8 g/dL (ref 12.0–15.0)
MCH: 31.7 pg (ref 26.0–34.0)
MCHC: 33.4 g/dL (ref 30.0–36.0)
MCV: 94.7 fL (ref 78.0–100.0)
Platelets: 300 10*3/uL (ref 150–400)
RBC: 4.36 MIL/uL (ref 3.87–5.11)
RDW: 13 % (ref 11.5–15.5)
WBC: 12.5 10*3/uL — AB (ref 4.0–10.5)

## 2016-07-09 LAB — URINALYSIS, ROUTINE W REFLEX MICROSCOPIC
BACTERIA UA: NONE SEEN
BILIRUBIN URINE: NEGATIVE
Glucose, UA: 50 mg/dL — AB
Ketones, ur: NEGATIVE mg/dL
LEUKOCYTES UA: NEGATIVE
NITRITE: NEGATIVE
Protein, ur: NEGATIVE mg/dL
Specific Gravity, Urine: 1.009 (ref 1.005–1.030)
pH: 6 (ref 5.0–8.0)

## 2016-07-09 LAB — LIPASE, BLOOD: Lipase: 39 U/L (ref 11–51)

## 2016-07-09 LAB — PREGNANCY, URINE: PREG TEST UR: NEGATIVE

## 2016-07-09 MED ORDER — IOPAMIDOL (ISOVUE-300) INJECTION 61%
INTRAVENOUS | Status: AC
  Administered 2016-07-09: 100 mL
  Filled 2016-07-09: qty 100

## 2016-07-09 MED ORDER — SODIUM CHLORIDE 0.9 % IV BOLUS (SEPSIS)
1000.0000 mL | Freq: Once | INTRAVENOUS | Status: AC
  Administered 2016-07-09: 1000 mL via INTRAVENOUS

## 2016-07-09 NOTE — ED Provider Notes (Signed)
Assumed care from PA Glassmanor at shift change.  Please see her note for full H&P.  Briefly, 26 y.o. F here with abdominal pain.  Started peri-umbilical, now more RLQ.  Reports nausea, decreased PO intake.  Lab work overall reassuring thus far.  Plan:  CT pending to assess for appendicitis.  Results for orders placed or performed during the hospital encounter of 07/09/16  Lipase, blood  Result Value Ref Range   Lipase 39 11 - 51 U/L  Comprehensive metabolic panel  Result Value Ref Range   Sodium 138 135 - 145 mmol/L   Potassium 4.0 3.5 - 5.1 mmol/L   Chloride 105 101 - 111 mmol/L   CO2 23 22 - 32 mmol/L   Glucose, Bld 92 65 - 99 mg/dL   BUN 8 6 - 20 mg/dL   Creatinine, Ser 4.09 0.44 - 1.00 mg/dL   Calcium 9.4 8.9 - 81.1 mg/dL   Total Protein 7.0 6.5 - 8.1 g/dL   Albumin 3.7 3.5 - 5.0 g/dL   AST 52 (H) 15 - 41 U/L   ALT 63 (H) 14 - 54 U/L   Alkaline Phosphatase 63 38 - 126 U/L   Total Bilirubin 0.4 0.3 - 1.2 mg/dL   GFR calc non Af Amer >60 >60 mL/min   GFR calc Af Amer >60 >60 mL/min   Anion gap 10 5 - 15  CBC  Result Value Ref Range   WBC 12.5 (H) 4.0 - 10.5 K/uL   RBC 4.36 3.87 - 5.11 MIL/uL   Hemoglobin 13.8 12.0 - 15.0 g/dL   HCT 91.4 78.2 - 95.6 %   MCV 94.7 78.0 - 100.0 fL   MCH 31.7 26.0 - 34.0 pg   MCHC 33.4 30.0 - 36.0 g/dL   RDW 21.3 08.6 - 57.8 %   Platelets 300 150 - 400 K/uL  Urinalysis, Routine w reflex microscopic  Result Value Ref Range   Color, Urine YELLOW YELLOW   APPearance CLEAR CLEAR   Specific Gravity, Urine 1.009 1.005 - 1.030   pH 6.0 5.0 - 8.0   Glucose, UA 50 (A) NEGATIVE mg/dL   Hgb urine dipstick MODERATE (A) NEGATIVE   Bilirubin Urine NEGATIVE NEGATIVE   Ketones, ur NEGATIVE NEGATIVE mg/dL   Protein, ur NEGATIVE NEGATIVE mg/dL   Nitrite NEGATIVE NEGATIVE   Leukocytes, UA NEGATIVE NEGATIVE   RBC / HPF 6-30 0 - 5 RBC/hpf   WBC, UA 0-5 0 - 5 WBC/hpf   Bacteria, UA NONE SEEN NONE SEEN   Squamous Epithelial / LPF 0-5 (A) NONE SEEN   Mucous  PRESENT   Pregnancy, urine  Result Value Ref Range   Preg Test, Ur NEGATIVE NEGATIVE   Ct Abdomen Pelvis W Contrast  Result Date: 07/09/2016 CLINICAL DATA:  Right lower quadrant pain and periumbilical pain. Symptoms beginning yesterday. Nausea. EXAM: CT ABDOMEN AND PELVIS WITH CONTRAST TECHNIQUE: Multidetector CT imaging of the abdomen and pelvis was performed using the standard protocol following bolus administration of intravenous contrast. CONTRAST:  ISOVUE-300 IOPAMIDOL (ISOVUE-300) INJECTION 61% COMPARISON:  None. FINDINGS: Lower chest: Lung bases are within normal. Hepatobiliary: Diffuse hepatic steatosis without focal mass. Previous cholecystectomy. Biliary tree is within normal. Pancreas: Within normal. Spleen: Within normal. Adrenals/Urinary Tract: Adrenal glands are normal. Kidneys are normal in size without hydronephrosis or nephrolithiasis. Bladder and ureters are normal. Stomach/Bowel: Stomach and small bowel are normal. Appendix is normal. Colon is normal. Vascular/Lymphatic: Vascular structures are normal. No evidence of adenopathy. Reproductive: Within normal. Other: No free  fluid or focal inflammatory change. Musculoskeletal: Within normal. IMPRESSION: No acute findings in the abdomen/pelvis. Hepatic steatosis without focal mass. Electronically Signed   By: Elberta Fortisaniel  Boyle M.D.   On: 07/09/2016 22:04    CT scan negative for acute findings.  Patient does not currently have PCP, given resource guide for local clinics if needed for ongoing issues.  Discussed plan with patient, she acknowledged understanding and agreed with plan of care.  Return precautions given for new or worsening symptoms.   Garlon HatchetSanders, Daiveon Markman M, PA-C 07/09/16 2325    Little, Ambrose Finlandachel Morgan, MD 07/10/16 1500

## 2016-07-09 NOTE — Discharge Instructions (Signed)
As we discussed your CT scan today did not show any acute findings. Recommend to follow-up with primary care office locally if you have any ongoing issues. Return to the ED for new or worsening symptoms.

## 2016-07-09 NOTE — ED Notes (Signed)
PA at bedside.

## 2016-07-09 NOTE — ED Provider Notes (Signed)
[facility]-EMERGENCY DEPT Provider Note   CSN: [id number] Arrival date & time: 07/09/16  1543     History   Chief Complaint Chief Complaint  Patient presents with  . Abdominal Pain    HPI Rachel Higgins is a 26 y.o. female who presents today for evaluation of right lower quadrant pain. She states that her pain started yesterday around her bellybutton and has gradually moved down to the right lower quadrant.  Her pain is currently a 3 out of 10, feels sharp. Her pain is associated with nausea, lack of appetite, cold chills.  She denies vaginal bleeding, vaginal discharge, states that she has been taking Clomid for multiple months in an attempt to get pregnant however she took a home urine pregnancy test today that was negative.    She has a previous abdominal surgery and had her gallbladder taken out around 2015.    Chart review shows that she was diagnosed with a "resolving" right ovarian cyst in 2015.  She does not appear to have had repeat imaging since then.    HPI  Past Medical History:  Diagnosis Date  . Broken arm 1996  . Contraceptive management 09/26/2012  . Migraine headache   . Painful urination 07/03/2013  . Postcoital bleeding 12/30/2014  . Right ovarian cyst 07/13/2013  . RUQ pain 07/20/2013  . Thyroid nodule   . Tired 12/26/2013  . Unspecified symptom associated with female genital organs 07/03/2013    Patient Active Problem List   Diagnosis Date Noted  . Postcoital bleeding 12/30/2014  . Tired 12/26/2013  . RUQ pain 07/20/2013  . Right ovarian cyst 07/13/2013  . Hematuria 07/03/2013  . Painful urination 07/03/2013  . Unspecified symptom associated with female genital organs 07/03/2013  . Contraceptive management 09/26/2012    Past Surgical History:  Procedure Laterality Date  . CHOLECYSTECTOMY N/A 09/03/2013   Procedure: LAPAROSCOPIC CHOLECYSTECTOMY;  Surgeon: Dalia Heading, MD;  Location: [facility];  Service: General;  Laterality: N/A;  . WISDOM TOOTH EXTRACTION       OB History    Gravida Para Term Preterm AB Living   0             SAB TAB Ectopic Multiple Live Births                   Home Medications    Prior to Admission medications   Medication Sig Start Date End Date Taking? Authorizing Provider  clomiPHENE (CLOMID) 50 MG tablet Take 3 daily, days 3-7 of cycle 06/28/16   Adline Potter, NP  omeprazole (PRILOSEC) 40 MG capsule Take 1 capsule (40 mg total) by mouth daily. 10/22/15   Unk Lightning, PA  Prenatal Vit-Fe Fumarate-FA (PRENATAL VITAMIN PO) Take by mouth daily.    [provider]  Probiotic Product (PROBIOTIC-10) CAPS Take 1 capsule by mouth daily.    [provider]    Family History Family History  Problem Relation Age of Onset  . Vision loss Mother   . Thyroid disease Maternal Aunt   . Thyroid cancer Maternal Grandfather   . Stroke Paternal Grandmother   . Arthritis Paternal Grandmother   . COPD Paternal Grandmother   . Heart disease Paternal Grandmother   . Breast cancer Paternal Grandmother   . Lung cancer Paternal Grandmother   . Diabetes Paternal Grandfather   . Hyperlipidemia Father   . Arthritis Father   . Esophageal cancer Neg Hx   . Pancreatic cancer Neg Hx   . Stomach  cancer Neg Hx   . Colon cancer Neg Hx   . Liver disease Neg Hx     Social History Social History  Substance Use Topics  . Smoking status: Current Every Day Smoker    Packs/day: 0.50    Years: 10.00    Types: Cigarettes  . Smokeless tobacco: Never Used     Comment: form given 10-22-15  . Alcohol use No     Allergies   Adhesive [tape]   Review of Systems Review of Systems  Constitutional: Positive for appetite change. Negative for chills and fever.  HENT: Negative for ear pain and sore throat.   Eyes: Negative for pain and visual disturbance.  Respiratory: Negative for cough and shortness of breath.   Cardiovascular: Negative for chest pain and palpitations.  Gastrointestinal: Positive for  abdominal pain and nausea. Negative for abdominal distention, constipation, diarrhea and vomiting.  Genitourinary: Negative for decreased urine volume, difficulty urinating, dysuria, flank pain, hematuria, pelvic pain, vaginal bleeding, vaginal discharge and vaginal pain.  Musculoskeletal: Negative for arthralgias, back pain, neck pain and neck stiffness.  Skin: Negative for color change and rash.  Neurological: Negative for seizures and syncope.  All other systems reviewed and are negative.    Physical Exam Updated Vital Signs BP (!) 134/96 (BP Location: Left Arm)   Pulse (!) 102   Temp 98.7 F (37.1 C) (Oral)   Resp 18   Ht 5' 3.5" (1.613 m)   Wt 96.6 kg   LMP 06/27/2016 (Exact Date)   SpO2 100%   BMI 37.14 kg/m   Physical Exam  Constitutional: She appears well-developed and well-nourished. No distress.  HENT:  Head: Normocephalic and atraumatic.  Eyes: Conjunctivae are normal. Right eye exhibits no discharge. Left eye exhibits no discharge. No scleral icterus.  Neck: Normal range of motion.  Cardiovascular: Normal rate and regular rhythm.   Pulmonary/Chest: Effort normal and breath sounds normal. No stridor. No respiratory distress.  Abdominal: Soft. Normal appearance and bowel sounds are normal. She exhibits no distension and no mass. There is no hepatosplenomegaly. There is tenderness in the right lower quadrant. There is tenderness at McBurney's point. There is no rigidity, no rebound, no guarding, no CVA tenderness and negative Murphy's sign. No hernia.  Abdomen is obese.   Musculoskeletal: She exhibits no edema or deformity.  Neurological: She is alert. She exhibits normal muscle tone.  Skin: Skin is warm and dry. She is not diaphoretic.  Psychiatric: She has a normal mood and affect. Her behavior is normal.  Nursing note and vitals reviewed.    ED Treatments / Results  Labs (all labs ordered are listed, but only abnormal results are displayed) Labs Reviewed    COMPREHENSIVE METABOLIC PANEL - Abnormal; Notable for the following:       Result Value   AST 52 (*)    ALT 63 (*)    All other components within normal limits  CBC - Abnormal; Notable for the following:    WBC 12.5 (*)    All other components within normal limits  URINALYSIS, ROUTINE W REFLEX MICROSCOPIC - Abnormal; Notable for the following:    Glucose, UA 50 (*)    Hgb urine dipstick MODERATE (*)    Squamous Epithelial / LPF 0-5 (*)    All other components within normal limits  LIPASE, BLOOD  PREGNANCY, URINE    EKG  EKG Interpretation None       Radiology No results found.  Procedures Procedures (including critical care time)  Medications Ordered in ED Medications  sodium chloride 0.9 % bolus 1,000 mL (not administered)     Initial Impression / Assessment and Plan / ED Course  I have reviewed the triage vital signs and the nursing notes.  Pertinent labs & imaging results that were available during my care of the patient were reviewed by me and considered in my medical decision making (see chart for details).    Given her symptoms of nausea, lack of appetite, periumbilical pain now in RLQ, white count and chills I am concerned for appendicitis.  Patient is not pregnant. Given history and exam will order CT abdomen and pelvis with contrast.    At shift change care was transferred to Sharilyn Sites PA-C who will follow pending studies, re-evaulate and determine disposition.      Final Clinical Impressions(s) / ED Diagnoses   Final diagnoses:  None    New Prescriptions New Prescriptions   No medications on file     Norman Clay 07/09/16 2044    Little, Ambrose Finland, MD 07/10/16 1500

## 2016-07-09 NOTE — ED Notes (Signed)
Pt updated on wait time.  

## 2016-07-09 NOTE — ED Notes (Signed)
Pt back from CT

## 2016-07-09 NOTE — ED Notes (Signed)
Pt transported to CT ?

## 2016-07-09 NOTE — ED Triage Notes (Signed)
Pt states she began having abdominal pain yesterday around her umbilicus. She reports the pain has now moved to the RLQ. Pt trying to conceive and taking fertility meds at this time.

## 2016-07-09 NOTE — ED Notes (Signed)
Pt states IV site is hurting.  After assessment, catheter wings cause irritation to skin.  Redressed site, and flushed, and flow of bolus intact

## 2017-01-03 ENCOUNTER — Encounter: Payer: Self-pay | Admitting: Adult Health

## 2017-01-03 ENCOUNTER — Ambulatory Visit (INDEPENDENT_AMBULATORY_CARE_PROVIDER_SITE_OTHER): Payer: BLUE CROSS/BLUE SHIELD | Admitting: Adult Health

## 2017-01-03 VITALS — BP 124/76 | HR 100 | Resp 16 | Ht 62.5 in | Wt 209.0 lb

## 2017-01-03 DIAGNOSIS — Z01411 Encounter for gynecological examination (general) (routine) with abnormal findings: Secondary | ICD-10-CM | POA: Diagnosis not present

## 2017-01-03 DIAGNOSIS — Z01419 Encounter for gynecological examination (general) (routine) without abnormal findings: Secondary | ICD-10-CM

## 2017-01-03 DIAGNOSIS — R102 Pelvic and perineal pain: Secondary | ICD-10-CM

## 2017-01-03 DIAGNOSIS — Z131 Encounter for screening for diabetes mellitus: Secondary | ICD-10-CM

## 2017-01-03 NOTE — Progress Notes (Signed)
Patient ID: Rachel Higgins, female   DOB: March 05, 1990, 26 y.o.   MRN: [id number] History of Present Illness: Rachel Higgins is a 26 year old white female, married in for well woman gyn exam,she had a normal pap 12/30/14.   Current Medications, Allergies, Past Medical History, Past Surgical History, Family History and Social History were reviewed in [facility] electronic medical record.     Review of Systems:  Patient denies any headaches, hearing loss, fatigue, blurred vision, shortness of breath, chest pain,  problems with bowel movements(diarrhea at times, has seen GI), urination, or intercourse. No joint pain or mood swings.+pain in abdomen and pelvic area, periods not always regular, and [facility] and everything was normal    Physical Exam:BP 124/76 (BP Location: Left Arm, Patient Position: Sitting, Cuff Size: Normal)   Pulse 100   Resp 16   Ht 5' 2.5" (1.588 m)   Wt 209 lb (94.8 kg)   LMP 12/15/2016 (Exact Date)   SpO2 98%   BMI 37.62 kg/m  Waist circumference is 40 inches. General:  Well developed, well nourished, no acute distress Skin:  Warm and dry Neck:  Midline trachea, normal thyroid, good ROM, no lymphadenopathy Lungs; Clear to auscultation bilaterally Breast:  No dominant palpable mass, retraction, or nipple discharge Cardiovascular: Regular rate and rhythm Abdomen:  Soft, epigastric tenderness, no hepatosplenomegaly Pelvic:  External genitalia is normal in appearance, no lesions.  The vagina is normal in appearance. Urethra has no lesions or masses. The cervix is smooth.  Uterus is felt to be normal size, shape, and contour.  No adnexal masses, +low bilateral  tenderness noted.Bladder is non tender, no masses felt. Extremities/musculoskeletal:  No swelling or varicosities noted, no clubbing or cyanosis Psych:  No mood changes, alert and cooperative,seems happy PHQ 2 score 0.  Impression: 1. Well woman exam with routine gynecological exam    2. Pelvic pain in female   3. Screening for diabetes mellitus       Plan: Check CBC,CMP,TSH and lipids,A1c Return in 1 week for GYN US Pap and physical in 1 year

## 2017-01-04 ENCOUNTER — Telehealth: Payer: Self-pay | Admitting: Adult Health

## 2017-01-04 LAB — CBC
HEMATOCRIT: 43.6 % (ref 34.0–46.6)
Hemoglobin: 14.1 g/dL (ref 11.1–15.9)
MCH: 31.1 pg (ref 26.6–33.0)
MCHC: 32.3 g/dL (ref 31.5–35.7)
MCV: 96 fL (ref 79–97)
Platelets: 321 10*3/uL (ref 150–379)
RBC: 4.54 x10E6/uL (ref 3.77–5.28)
RDW: 13.9 % (ref 12.3–15.4)
WBC: 9.9 10*3/uL (ref 3.4–10.8)

## 2017-01-04 LAB — COMPREHENSIVE METABOLIC PANEL
A/G RATIO: 1.7 (ref 1.2–2.2)
ALK PHOS: 73 IU/L (ref 39–117)
ALT: 57 IU/L — ABNORMAL HIGH (ref 0–32)
AST: 42 IU/L — AB (ref 0–40)
Albumin: 4.5 g/dL (ref 3.5–5.5)
BILIRUBIN TOTAL: 0.3 mg/dL (ref 0.0–1.2)
BUN/Creatinine Ratio: 9 (ref 9–23)
BUN: 7 mg/dL (ref 6–20)
CO2: 24 mmol/L (ref 20–29)
Calcium: 9.6 mg/dL (ref 8.7–10.2)
Chloride: 104 mmol/L (ref 96–106)
Creatinine, Ser: 0.74 mg/dL (ref 0.57–1.00)
GFR calc Af Amer: 127 mL/min/{1.73_m2} (ref >59)
GFR calc non Af Amer: 111 mL/min/{1.73_m2} (ref >59)
GLOBULIN, TOTAL: 2.6 g/dL (ref 1.5–4.5)
Glucose: 98 mg/dL (ref 65–99)
POTASSIUM: 4.3 mmol/L (ref 3.5–5.2)
Sodium: 143 mmol/L (ref 134–144)
Total Protein: 7.1 g/dL (ref 6.0–8.5)

## 2017-01-04 LAB — TSH: TSH: 2.37 u[IU]/mL (ref 0.450–4.500)

## 2017-01-04 LAB — LIPID PANEL
CHOLESTEROL TOTAL: 238 mg/dL — AB (ref 100–199)
Chol/HDL Ratio: 7 ratio — ABNORMAL HIGH (ref 0.0–4.4)
HDL: 34 mg/dL — ABNORMAL LOW (ref >39)
LDL CALC: 167 mg/dL — AB (ref 0–99)
Triglycerides: 184 mg/dL — ABNORMAL HIGH (ref 0–149)
VLDL Cholesterol Cal: 37 mg/dL (ref 5–40)

## 2017-01-04 LAB — HEMOGLOBIN A1C
ESTIMATED AVERAGE GLUCOSE: 126 mg/dL
Hgb A1c MFr Bld: 6 % — ABNORMAL HIGH (ref 4.8–5.6)

## 2017-01-04 NOTE — Telephone Encounter (Signed)
Left message with labs results, A1c elevated, liver tests elevated but better than several months ago, cholesterol elevated, needs to decrease carbs and increase activity, and try red yeast rice and recheck labs in 3 months, weight loss will help, call me back.Form for work faxed

## 2017-01-05 ENCOUNTER — Telehealth: Payer: Self-pay | Admitting: Adult Health

## 2017-01-06 NOTE — Telephone Encounter (Signed)
Pt aware of labs and need to decrease carbs, increase exercise and try red yeast rice and recheck labs in 3 months placed in recall

## 2017-01-11 ENCOUNTER — Ambulatory Visit (INDEPENDENT_AMBULATORY_CARE_PROVIDER_SITE_OTHER): Payer: BLUE CROSS/BLUE SHIELD

## 2017-01-11 DIAGNOSIS — R102 Pelvic and perineal pain: Secondary | ICD-10-CM | POA: Diagnosis not present

## 2017-01-11 NOTE — Progress Notes (Signed)
PELVIC US TA/TV: homogeneous anteverted uterus,small post right myometrial cyst 2.4 x 4.1 x 1.8 mm,normal ovaries bilat,ovaries appear mobile,EEC 6.6 mm,no free fluid,no pain during ultrasound

## 2017-01-12 ENCOUNTER — Telehealth: Payer: Self-pay | Admitting: Adult Health

## 2017-01-12 NOTE — Telephone Encounter (Signed)
Pt aware that US is normal,try Advil with ovulation pain

## 2017-12-15 DIAGNOSIS — Z0000 Encounter for general adult medical examination without abnormal findings: Secondary | ICD-10-CM | POA: Diagnosis not present

## 2017-12-15 DIAGNOSIS — Z6835 Body mass index (BMI) 35.0-35.9, adult: Secondary | ICD-10-CM | POA: Diagnosis not present

## 2017-12-15 DIAGNOSIS — J029 Acute pharyngitis, unspecified: Secondary | ICD-10-CM | POA: Diagnosis not present

## 2017-12-15 DIAGNOSIS — J069 Acute upper respiratory infection, unspecified: Secondary | ICD-10-CM | POA: Diagnosis not present

## 2017-12-19 DIAGNOSIS — G43909 Migraine, unspecified, not intractable, without status migrainosus: Secondary | ICD-10-CM | POA: Diagnosis not present

## 2017-12-19 DIAGNOSIS — E041 Nontoxic single thyroid nodule: Secondary | ICD-10-CM | POA: Diagnosis not present

## 2018-05-18 ENCOUNTER — Other Ambulatory Visit: Payer: Self-pay

## 2018-05-18 ENCOUNTER — Encounter: Payer: Self-pay | Admitting: Adult Health

## 2018-05-18 ENCOUNTER — Ambulatory Visit: Payer: Commercial Managed Care - PPO | Admitting: Adult Health

## 2018-05-18 VITALS — BP 130/90 | HR 98 | Ht 63.0 in | Wt 216.0 lb

## 2018-05-18 DIAGNOSIS — N926 Irregular menstruation, unspecified: Secondary | ICD-10-CM

## 2018-05-18 DIAGNOSIS — R1032 Left lower quadrant pain: Secondary | ICD-10-CM

## 2018-05-18 DIAGNOSIS — Z3202 Encounter for pregnancy test, result negative: Secondary | ICD-10-CM

## 2018-05-18 DIAGNOSIS — R030 Elevated blood-pressure reading, without diagnosis of hypertension: Secondary | ICD-10-CM | POA: Diagnosis not present

## 2018-05-18 HISTORY — DX: Left lower quadrant pain: R10.32

## 2018-05-18 HISTORY — DX: Irregular menstruation, unspecified: N92.6

## 2018-05-18 LAB — POCT URINE PREGNANCY: Preg Test, Ur: NEGATIVE

## 2018-05-18 NOTE — Progress Notes (Signed)
Patient ID: Rachel Higgins, female   DOB: Dec 08, 1990, 28 y.o.   MRN: [id number] History of Present Illness:  Rachel Higgins is a 28 year old white female, married, G0P0, using no birth control in for UPT, period is late and has had cramping and LLQ pain for about 2 weeks, is spotting brown now.  She would like to be pregnant.   Current Medications, Allergies, Past Medical History, Past Surgical History, Family History and Social History were reviewed in [employer] record.     Review of Systems:  Period late +cramping LLQ pain for about 2 weeks now, spotting brown at times   Physical Exam:BP (!) 142/94 (BP Location: Left Arm, Patient Position: Sitting, Cuff Size: Normal)   Pulse 98   Ht 5\' 3"  (1.6 m)   Wt 216 lb (98 kg)   LMP 04/10/2018 (Exact Date)   BMI 38.26 kg/m  BP recheck 130/90 UPT negative.  General:  Well developed, well nourished, no acute distress Skin:  Warm and dry Lungs; Clear to auscultation bilaterally Cardiovascular: Regular rate and rhythm Abdomen:  Soft, + tenderness LLQ, no rebound,no hepatosplenomegaly Pelvic: Deferred Psych:  No mood changes, alert and cooperative,seems happy Fall risk is low. PHQ 2 score 0.  Impression: 1. Late period   2. LLQ pain   3. Elevated BP without diagnosis of hypertension       Plan: Return next week for GYN [name] Then in 2 weeks for pap and physical  Talk with husband about getting Semen analysis,number given for [facility]

## 2018-05-25 ENCOUNTER — Ambulatory Visit (INDEPENDENT_AMBULATORY_CARE_PROVIDER_SITE_OTHER): Payer: Commercial Managed Care - PPO

## 2018-05-25 ENCOUNTER — Other Ambulatory Visit: Payer: Self-pay

## 2018-05-25 DIAGNOSIS — R1032 Left lower quadrant pain: Secondary | ICD-10-CM | POA: Diagnosis not present

## 2018-05-25 DIAGNOSIS — N926 Irregular menstruation, unspecified: Secondary | ICD-10-CM | POA: Diagnosis not present

## 2018-05-25 NOTE — Progress Notes (Signed)
PELVIC US TA/TV: homogeneous anteverted uterus,wnl,EEC 6.6 mm,normal ovaries bilat,ovaries appear mobile,no pain during ultrasound,no free fluid

## 2018-06-06 ENCOUNTER — Other Ambulatory Visit: Payer: Commercial Managed Care - PPO | Admitting: Adult Health

## 2018-08-02 ENCOUNTER — Other Ambulatory Visit: Payer: Commercial Managed Care - PPO | Admitting: Adult Health

## 2018-08-30 ENCOUNTER — Ambulatory Visit (INDEPENDENT_AMBULATORY_CARE_PROVIDER_SITE_OTHER): Payer: Commercial Managed Care - PPO | Admitting: Adult Health

## 2018-08-30 ENCOUNTER — Encounter: Payer: Self-pay | Admitting: Adult Health

## 2018-08-30 ENCOUNTER — Other Ambulatory Visit: Payer: Self-pay

## 2018-08-30 ENCOUNTER — Other Ambulatory Visit (HOSPITAL_COMMUNITY)
Admission: RE | Admit: 2018-08-30 | Discharge: 2018-08-30 | Disposition: A | Discharge status: Home / Self Care | Payer: Commercial Managed Care - PPO | Source: Ambulatory Visit | Attending: Adult Health | Admitting: Adult Health

## 2018-08-30 VITALS — BP 133/90 | HR 87 | Ht 62.25 in | Wt 212.0 lb

## 2018-08-30 DIAGNOSIS — R7309 Other abnormal glucose: Secondary | ICD-10-CM | POA: Insufficient documentation

## 2018-08-30 DIAGNOSIS — Z1321 Encounter for screening for nutritional disorder: Secondary | ICD-10-CM | POA: Insufficient documentation

## 2018-08-30 DIAGNOSIS — Z01419 Encounter for gynecological examination (general) (routine) without abnormal findings: Secondary | ICD-10-CM | POA: Insufficient documentation

## 2018-08-30 DIAGNOSIS — E7800 Pure hypercholesterolemia, unspecified: Secondary | ICD-10-CM | POA: Insufficient documentation

## 2018-08-30 DIAGNOSIS — R030 Elevated blood-pressure reading, without diagnosis of hypertension: Secondary | ICD-10-CM

## 2018-08-30 DIAGNOSIS — R12 Heartburn: Secondary | ICD-10-CM | POA: Insufficient documentation

## 2018-08-30 MED ORDER — OMEPRAZOLE 40 MG PO CPDR: 40.0000 mg | DELAYED_RELEASE_CAPSULE | Freq: Every day | ORAL | 3 refills | Status: DC

## 2018-08-30 NOTE — Progress Notes (Addendum)
Patient ID: Rachel Higgins, female   DOB: [birth year], 28 y.o.   MRN: [id number] History of Present Illness: Rachel Higgins is a 28 year old white female, married, G0P0, if for a well woman gyn exam and pap. She says husband has not done SA yet.    Current Medications, Allergies, Past Medical History, Past Surgical History, Family History and Social History were reviewed in [employer] record.     Review of Systems: Patient denies any headaches, hearing loss, fatigue, blurred vision, shortness of breath, chest pain, abdominal pain, problems with bowel movements, urination, or intercourse. No joint pain or mood swings. +heartburn,takes Prilosec.    Physical Exam:BP 133/90 (BP Location: Right Arm, Patient Position: Sitting, Cuff Size: Large)   Pulse 87   Ht 5' 2.25" (1.581 m)   Wt 212 lb (96.2 kg)   LMP 08/02/2018   BMI 38.46 kg/m waist is 43 inches. General:  Well developed, well nourished, no acute distress Skin:  Warm and dry Neck:  Midline trachea, normal thyroid, good ROM, no lymphadenopathy Lungs; Clear to auscultation bilaterally Breast:  No dominant palpable mass, retraction, or nipple discharge Cardiovascular: Regular rate and rhythm Abdomen:  Soft, non tender, no hepatosplenomegaly Pelvic:  External genitalia is normal in appearance, no lesions.  The vagina is normal in appearance. Urethra has no lesions or masses. The cervix is nulliparous, pap with HPV performed.  Uterus is felt to be normal size, shape, and contour,mildly tender.  No adnexal masses or tenderness noted.Bladder is non tender, no masses felt. Extremities/musculoskeletal:  No swelling or varicosities noted, no clubbing or cyanosis Psych:  No mood changes, alert and cooperative,seems happy Fall risk is low. PHQ 2 score 0. Examination chaperoned by Levy Pupa LPN. She declines HIV testing.  Impression: 1. Encounter for gynecological examination with Papanicolaou smear of cervix   2. Elevated BP  without diagnosis of hypertension   3. Elevated cholesterol   4. Elevated hemoglobin A1c   5. Encounter for vitamin deficiency screening   6. Heartburn       Plan: Check CBC,CMP,TSH and lipids,A1c and vitamin D, orders given to get fasting needs for work Pap with HPV sent Physical in 1 year Pap in 3 if normal Watch salt  Will recheck BP in near future, when picks up form, when labs back  Meds ordered this encounter  Medications  . omeprazole (PRILOSEC) 40 MG capsule    Sig: Take 1 capsule (40 mg total) by mouth daily.    Dispense:  90 capsule    Refill:  3    Order Specific Question:   Supervising Provider    Answer:   Tania Ade H [[id number]]

## 2018-08-31 LAB — CYTOLOGY - PAP
Diagnosis: NEGATIVE
HPV: NOT DETECTED

## 2018-09-07 ENCOUNTER — Telehealth: Payer: Self-pay | Admitting: Adult Health

## 2018-09-07 DIAGNOSIS — E559 Vitamin D deficiency, unspecified: Secondary | ICD-10-CM

## 2018-09-07 LAB — LIPID PANEL
Chol/HDL Ratio: 6.9 ratio — ABNORMAL HIGH (ref 0.0–4.4)
Cholesterol, Total: 228 mg/dL — ABNORMAL HIGH (ref 100–199)
HDL: 33 mg/dL — ABNORMAL LOW (ref >39)
LDL Calculated: 159 mg/dL — ABNORMAL HIGH (ref 0–99)
Triglycerides: 179 mg/dL — ABNORMAL HIGH (ref 0–149)
VLDL Cholesterol Cal: 36 mg/dL (ref 5–40)

## 2018-09-07 LAB — COMPREHENSIVE METABOLIC PANEL
ALT: 24 IU/L (ref 0–32)
AST: 19 IU/L (ref 0–40)
Albumin/Globulin Ratio: 1.9 (ref 1.2–2.2)
Albumin: 4.3 g/dL (ref 3.9–5.0)
Alkaline Phosphatase: 66 IU/L (ref 39–117)
BUN/Creatinine Ratio: 12 (ref 9–23)
BUN: 9 mg/dL (ref 6–20)
Bilirubin Total: 0.2 mg/dL (ref 0.0–1.2)
CO2: 23 mmol/L (ref 20–29)
Calcium: 9.3 mg/dL (ref 8.7–10.2)
Chloride: 104 mmol/L (ref 96–106)
Creatinine, Ser: 0.76 mg/dL (ref 0.57–1.00)
GFR calc Af Amer: 122 mL/min/{1.73_m2} (ref >59)
GFR calc non Af Amer: 106 mL/min/{1.73_m2} (ref >59)
Globulin, Total: 2.3 g/dL (ref 1.5–4.5)
Glucose: 108 mg/dL — ABNORMAL HIGH (ref 65–99)
Potassium: 4.4 mmol/L (ref 3.5–5.2)
Sodium: 141 mmol/L (ref 134–144)
Total Protein: 6.6 g/dL (ref 6.0–8.5)

## 2018-09-07 LAB — CBC
Hematocrit: 41.1 % (ref 34.0–46.6)
Hemoglobin: 13.4 g/dL (ref 11.1–15.9)
MCH: 29.5 pg (ref 26.6–33.0)
MCHC: 32.6 g/dL (ref 31.5–35.7)
MCV: 90 fL (ref 79–97)
Platelets: 381 10*3/uL (ref 150–450)
RBC: 4.55 x10E6/uL (ref 3.77–5.28)
RDW: 13.3 % (ref 11.7–15.4)
WBC: 10.2 10*3/uL (ref 3.4–10.8)

## 2018-09-07 LAB — VITAMIN D 25 HYDROXY (VIT D DEFICIENCY, FRACTURES): Vit D, 25-Hydroxy: 14.1 ng/mL — ABNORMAL LOW (ref 30.0–100.0)

## 2018-09-07 LAB — TSH: TSH: 2.21 u[IU]/mL (ref 0.450–4.500)

## 2018-09-07 LAB — HEMOGLOBIN A1C
Est. average glucose Bld gHb Est-mCnc: 123 mg/dL
Hgb A1c MFr Bld: 5.9 % — ABNORMAL HIGH (ref 4.8–5.6)

## 2018-09-07 MED ORDER — CHOLECALCIFEROL 125 MCG (5000 UT) PO CAPS: 5000.0000 [IU] | ORAL_CAPSULE | Freq: Every day | ORAL | Status: AC

## 2018-09-07 NOTE — Telephone Encounter (Signed)
Pt aware of labs, need to take vitamin D 3 5000 IU Daily and cut fats, sugar and salt and be more activity.

## 2018-10-11 ENCOUNTER — Telehealth: Payer: Self-pay | Admitting: Adult Health

## 2018-10-11 NOTE — Telephone Encounter (Signed)
Patient called, stated she has had her cycle every other week, bad this time.  This has never happened before, she wants to know if this is normal  [phone]

## 2019-09-05 ENCOUNTER — Other Ambulatory Visit: Payer: Self-pay | Admitting: Adult Health

## 2019-09-06 ENCOUNTER — Encounter: Payer: Self-pay | Admitting: Adult Health

## 2019-09-06 ENCOUNTER — Ambulatory Visit (INDEPENDENT_AMBULATORY_CARE_PROVIDER_SITE_OTHER): Payer: Commercial Managed Care - PPO | Admitting: Adult Health

## 2019-09-06 VITALS — BP 146/96 | HR 108 | Ht 62.2 in | Wt 218.6 lb

## 2019-09-06 DIAGNOSIS — Z01419 Encounter for gynecological examination (general) (routine) without abnormal findings: Secondary | ICD-10-CM

## 2019-09-06 DIAGNOSIS — F32A Depression, unspecified: Secondary | ICD-10-CM | POA: Insufficient documentation

## 2019-09-06 DIAGNOSIS — E7800 Pure hypercholesterolemia, unspecified: Secondary | ICD-10-CM

## 2019-09-06 DIAGNOSIS — R7309 Other abnormal glucose: Secondary | ICD-10-CM | POA: Diagnosis not present

## 2019-09-06 DIAGNOSIS — E559 Vitamin D deficiency, unspecified: Secondary | ICD-10-CM | POA: Diagnosis not present

## 2019-09-06 DIAGNOSIS — I10 Essential (primary) hypertension: Secondary | ICD-10-CM | POA: Insufficient documentation

## 2019-09-06 DIAGNOSIS — F329 Major depressive disorder, single episode, unspecified: Secondary | ICD-10-CM

## 2019-09-06 DIAGNOSIS — F419 Anxiety disorder, unspecified: Secondary | ICD-10-CM | POA: Insufficient documentation

## 2019-09-06 HISTORY — DX: Depression, unspecified: F32.A

## 2019-09-06 HISTORY — DX: Encounter for gynecological examination (general) (routine) without abnormal findings: Z01.419

## 2019-09-06 HISTORY — DX: Essential (primary) hypertension: I10

## 2019-09-06 MED ORDER — SERTRALINE HCL 50 MG PO TABS: 50.0000 mg | ORAL_TABLET | Freq: Every day | ORAL | 6 refills | Status: DC

## 2019-09-06 MED ORDER — AMLODIPINE BESYLATE 5 MG PO TABS: 5.0000 mg | ORAL_TABLET | Freq: Every day | ORAL | 6 refills | Status: DC

## 2019-09-06 NOTE — Patient Instructions (Signed)
DASH Eating Plan DASH stands for "Dietary Approaches to Stop Hypertension." The DASH eating plan is a healthy eating plan that has been shown to reduce high blood pressure (hypertension). It may also reduce your risk for type 2 diabetes, heart disease, and stroke. The DASH eating plan may also help with weight loss. What are tips for following this plan?  General guidelines  Avoid eating more than 2,300 mg (milligrams) of salt (sodium) a day. If you have hypertension, you may need to reduce your sodium intake to 1,500 mg a day.  Limit alcohol intake to no more than 1 drink a day for nonpregnant women and 2 drinks a day for men. One drink equals 12 oz of beer, 5 oz of wine, or 1 oz of hard liquor.  Work with your health care provider to maintain a healthy body weight or to lose weight. Ask what an ideal weight is for you.  Get at least 30 minutes of exercise that causes your heart to beat faster (aerobic exercise) most days of the week. Activities may include walking, swimming, or biking.  Work with your health care provider or diet and nutrition specialist (dietitian) to adjust your eating plan to your individual calorie needs. Reading food labels   Check food labels for the amount of sodium per serving. Choose foods with less than 5 percent of the Daily Value of sodium. Generally, foods with less than 300 mg of sodium per serving fit into this eating plan.  To find whole grains, look for the word "whole" as the first word in the ingredient list. Shopping  Buy products labeled as "low-sodium" or "no salt added."  Buy fresh foods. Avoid canned foods and premade or frozen meals. Cooking  Avoid adding salt when cooking. Use salt-free seasonings or herbs instead of table salt or sea salt. Check with your health care provider or pharmacist before using salt substitutes.  Do not fry foods. Cook foods using healthy methods such as baking, boiling, grilling, and broiling instead.  Cook with  heart-healthy oils, such as olive, canola, soybean, or sunflower oil. Meal planning  Eat a balanced diet that includes: ? 5 or more servings of fruits and vegetables each day. At each meal, try to fill half of your plate with fruits and vegetables. ? Up to 6-8 servings of whole grains each day. ? Less than 6 oz of lean meat, poultry, or fish each day. A 3-oz serving of meat is about the same size as a deck of cards. One egg equals 1 oz. ? 2 servings of low-fat dairy each day. ? A serving of nuts, seeds, or beans 5 times each week. ? Heart-healthy fats. Healthy fats called Omega-3 fatty acids are found in foods such as flaxseeds and coldwater fish, like sardines, salmon, and mackerel.  Limit how much you eat of the following: ? Canned or prepackaged foods. ? Food that is high in trans fat, such as fried foods. ? Food that is high in saturated fat, such as fatty meat. ? Sweets, desserts, sugary drinks, and other foods with added sugar. ? Full-fat dairy products.  Do not salt foods before eating.  Try to eat at least 2 vegetarian meals each week.  Eat more home-cooked food and less restaurant, buffet, and fast food.  When eating at a restaurant, ask that your food be prepared with less salt or no salt, if possible. What foods are recommended? The items listed may not be a complete list. Talk with your dietitian about   what dietary choices are best for you. Grains Whole-grain or whole-wheat bread. Whole-grain or whole-wheat pasta. Brown rice. Oatmeal. Quinoa. Bulgur. Whole-grain and low-sodium cereals. Pita bread. Low-fat, low-sodium crackers. Whole-wheat flour tortillas. Vegetables Fresh or frozen vegetables (raw, steamed, roasted, or grilled). Low-sodium or reduced-sodium tomato and vegetable juice. Low-sodium or reduced-sodium tomato sauce and tomato paste. Low-sodium or reduced-sodium canned vegetables. Fruits All fresh, dried, or frozen fruit. Canned fruit in natural juice (without  added sugar). Meat and other protein foods Skinless chicken or turkey. Ground chicken or turkey. Pork with fat trimmed off. Fish and seafood. Egg whites. Dried beans, peas, or lentils. Unsalted nuts, nut butters, and seeds. Unsalted canned beans. Lean cuts of beef with fat trimmed off. Low-sodium, lean deli meat. Dairy Low-fat (1%) or fat-free (skim) milk. Fat-free, low-fat, or reduced-fat cheeses. Nonfat, low-sodium ricotta or cottage cheese. Low-fat or nonfat yogurt. Low-fat, low-sodium cheese. Fats and oils Soft margarine without trans fats. Vegetable oil. Low-fat, reduced-fat, or light mayonnaise and salad dressings (reduced-sodium). Canola, safflower, olive, soybean, and sunflower oils. Avocado. Seasoning and other foods Herbs. Spices. Seasoning mixes without salt. Unsalted popcorn and pretzels. Fat-free sweets. What foods are not recommended? The items listed may not be a complete list. Talk with your dietitian about what dietary choices are best for you. Grains Baked goods made with fat, such as croissants, muffins, or some breads. Dry pasta or rice meal packs. Vegetables Creamed or fried vegetables. Vegetables in a cheese sauce. Regular canned vegetables (not low-sodium or reduced-sodium). Regular canned tomato sauce and paste (not low-sodium or reduced-sodium). Regular tomato and vegetable juice (not low-sodium or reduced-sodium). Pickles. Olives. Fruits Canned fruit in a light or heavy syrup. Fried fruit. Fruit in cream or butter sauce. Meat and other protein foods Fatty cuts of meat. Ribs. Fried meat. Bacon. Sausage. Bologna and other processed lunch meats. Salami. Fatback. Hotdogs. Bratwurst. Salted nuts and seeds. Canned beans with added salt. Canned or smoked fish. Whole eggs or egg yolks. Chicken or turkey with skin. Dairy Whole or 2% milk, cream, and half-and-half. Whole or full-fat cream cheese. Whole-fat or sweetened yogurt. Full-fat cheese. Nondairy creamers. Whipped toppings.  Processed cheese and cheese spreads. Fats and oils Butter. Stick margarine. Lard. Shortening. Ghee. Bacon fat. Tropical oils, such as coconut, palm kernel, or palm oil. Seasoning and other foods Salted popcorn and pretzels. Onion salt, garlic salt, seasoned salt, table salt, and sea salt. Worcestershire sauce. Tartar sauce. Barbecue sauce. Teriyaki sauce. Soy sauce, including reduced-sodium. Steak sauce. Canned and packaged gravies. Fish sauce. Oyster sauce. Cocktail sauce. Horseradish that you find on the shelf. Ketchup. Mustard. Meat flavorings and tenderizers. Bouillon cubes. Hot sauce and Tabasco sauce. Premade or packaged marinades. Premade or packaged taco seasonings. Relishes. Regular salad dressings. Where to find more information:  National Heart, Lung, and Blood Institute: www.nhlbi.nih.gov  American Heart Association: www.heart.org Summary  The DASH eating plan is a healthy eating plan that has been shown to reduce high blood pressure (hypertension). It may also reduce your risk for type 2 diabetes, heart disease, and stroke.  With the DASH eating plan, you should limit salt (sodium) intake to 2,300 mg a day. If you have hypertension, you may need to reduce your sodium intake to 1,500 mg a day.  When on the DASH eating plan, aim to eat more fresh fruits and vegetables, whole grains, lean proteins, low-fat dairy, and heart-healthy fats.  Work with your health care provider or diet and nutrition specialist (dietitian) to adjust your eating plan to your   individual calorie needs. This information is not intended to replace advice given to you by your health care provider. Make sure you discuss any questions you have with your health care provider. Document Revised: 01/28/2017 Document Reviewed: 02/09/2016 Elsevier Patient Education  2020 Elsevier Inc.  

## 2019-09-06 NOTE — Progress Notes (Signed)
Patient ID: Rachel Higgins, female   DOB: 03/08/1990, 29 y.o.   MRN: [id number] History of Present Illness: Cyndra is a 29 year old white female, married, G0P0, in for a well woman gyn exam. She had a normal pap with negative HPV 08/30/2018. She is stressed at work and has had 3 family members die this year. She is easily teary and feels anxious. She is not on any birth control, has not been able to get pregnant.   Current Medications, Allergies, Past Medical History, Past Surgical History, Family History and Social History were reviewed in [employer] record.     Review of Systems:  Patient denies any headaches, hearing loss, fatigue, blurred vision, shortness of breath, chest pain, abdominal pain, problems with bowel movements, urination, or intercourse. No joint pain or mood swings. See HPI for positives.  Physical Exam:BP (!) 146/96 (BP Location: Right Arm, Patient Position: Sitting, Cuff Size: Large)   Pulse (!) 108   Ht 5' 2.2" (1.58 m)   Wt 218 lb 9.6 oz (99.2 kg)   LMP 08/19/2019   BMI 39.73 kg/m  waist was 45 1/2 inches.  General:  Well developed, well nourished, no acute distress Skin:  Warm and dry Neck:  Midline trachea, normal thyroid, good ROM, no lymphadenopathy Lungs; Clear to auscultation bilaterally Breast:  No dominant palpable mass, retraction, or nipple discharge Cardiovascular: Regular rate and rhythm Abdomen:  Soft, non tender, no hepatosplenomegaly Pelvic:  External genitalia is normal in appearance, no lesions.  The vagina is normal in appearance. Urethra has no lesions or masses. The cervix is bulbous.  Uterus is felt to be normal size, shape, and contour,midly tender.  No adnexal masses or tenderness noted.Bladder is non tender, no masses felt. Extremities/musculoskeletal:  No swelling or varicosities noted, no clubbing or cyanosis Psych:  No mood changes, alert and cooperative,seems happy AA is 1 Fall risk is low PHQ 9 score is 10, no SI,  open to meds, will rx zoloft Examination chaperoned by Federico Flake CMA  [facility] - 09/06/19 1600      Pregnancy Intention Screening   Does the patient want to become pregnant in the next year? Yes    Does the patient's partner want to become pregnant in the next year? Yes    Would the patient like to discuss contraceptive options today? No           Impression and Plan:  1. Encounter for well woman exam with routine gynecological exam Physical in 1 year Pap in 2023   2. Elevated hemoglobin A1c Check A1c  3. Elevated cholesterol Check lipids  4. Vitamin D deficiency Check vitamin D  5. Hypertension, unspecified type Will rx norvasc 5 mg 1 daily Decrease salt and sugar Review handout on DASH diet   6. Anxiety and depression Will Rx Zoloft Meds ordered this encounter  Medications  . amLODipine (NORVASC) 5 MG tablet    Sig: Take 1 tablet (5 mg total) by mouth daily.    Dispense:  30 tablet    Refill:  6    Order Specific Question:   Supervising Provider    Answer:   Despina Hidden, LUTHER H [[id number]]  . sertraline (ZOLOFT) 50 MG tablet    Sig: Take 1 tablet (50 mg total) by mouth daily.    Dispense:  30 tablet    Refill:  6    Order Specific Question:   Supervising Provider    Answer:   Duane Lope H [[id number]]  Will see back in 6 weeks and check BP and ROS on starting zoloft

## 2019-09-11 ENCOUNTER — Telehealth: Payer: Self-pay | Admitting: Adult Health

## 2019-09-11 LAB — HEMOGLOBIN A1C
Est. average glucose Bld gHb Est-mCnc: 126 mg/dL
Hgb A1c MFr Bld: 6 % — ABNORMAL HIGH (ref 4.8–5.6)

## 2019-09-11 LAB — COMPREHENSIVE METABOLIC PANEL
ALT: 33 IU/L — ABNORMAL HIGH (ref 0–32)
AST: 24 IU/L (ref 0–40)
Albumin/Globulin Ratio: 1.7 (ref 1.2–2.2)
Albumin: 4.3 g/dL (ref 3.8–4.8)
Alkaline Phosphatase: 77 IU/L (ref 48–121)
BUN/Creatinine Ratio: 9 (ref 9–23)
BUN: 7 mg/dL (ref 6–20)
Bilirubin Total: 0.3 mg/dL (ref 0.0–1.2)
CO2: 22 mmol/L (ref 20–29)
Calcium: 9.3 mg/dL (ref 8.7–10.2)
Chloride: 102 mmol/L (ref 96–106)
Creatinine, Ser: 0.79 mg/dL (ref 0.57–1.00)
GFR calc Af Amer: 115 mL/min/{1.73_m2} (ref >59)
GFR calc non Af Amer: 100 mL/min/{1.73_m2} (ref >59)
Globulin, Total: 2.6 g/dL (ref 1.5–4.5)
Glucose: 119 mg/dL — ABNORMAL HIGH (ref 65–99)
Potassium: 4.6 mmol/L (ref 3.5–5.2)
Sodium: 138 mmol/L (ref 134–144)
Total Protein: 6.9 g/dL (ref 6.0–8.5)

## 2019-09-11 LAB — LIPID PANEL
Chol/HDL Ratio: 6.7 ratio — ABNORMAL HIGH (ref 0.0–4.4)
Cholesterol, Total: 227 mg/dL — ABNORMAL HIGH (ref 100–199)
HDL: 34 mg/dL — ABNORMAL LOW (ref >39)
LDL Chol Calc (NIH): 166 mg/dL — ABNORMAL HIGH (ref 0–99)
Triglycerides: 145 mg/dL (ref 0–149)
VLDL Cholesterol Cal: 27 mg/dL (ref 5–40)

## 2019-09-11 LAB — VITAMIN D 25 HYDROXY (VIT D DEFICIENCY, FRACTURES): Vit D, 25-Hydroxy: 25.5 ng/mL — ABNORMAL LOW (ref 30.0–100.0)

## 2019-09-11 NOTE — Telephone Encounter (Signed)
Left message that A1c 6 so prediabetic and cholesterol is elevated, and vitamin D low, so call me to discuss

## 2019-09-11 NOTE — Telephone Encounter (Signed)
Pt aware of labs, to try lifestyle modifications first, decrease carbs and fats and increase exercise, try to lose weight, WW is good, take vitamin D 5000 IU and omega 3's and recheck labs in 6 months, she says norvasc made her feel bad, try 1/2 tablet at night for few days then go to 1 tablet.

## 2019-10-18 ENCOUNTER — Ambulatory Visit: Payer: Commercial Managed Care - PPO | Admitting: Adult Health

## 2019-10-18 ENCOUNTER — Encounter: Payer: Self-pay | Admitting: Adult Health

## 2019-10-18 ENCOUNTER — Other Ambulatory Visit: Payer: Self-pay

## 2019-10-18 VITALS — BP 141/90 | HR 116 | Ht 62.0 in | Wt 218.0 lb

## 2019-10-18 DIAGNOSIS — F32A Depression, unspecified: Secondary | ICD-10-CM

## 2019-10-18 DIAGNOSIS — F419 Anxiety disorder, unspecified: Secondary | ICD-10-CM | POA: Diagnosis not present

## 2019-10-18 DIAGNOSIS — F329 Major depressive disorder, single episode, unspecified: Secondary | ICD-10-CM | POA: Diagnosis not present

## 2019-10-18 DIAGNOSIS — I10 Essential (primary) hypertension: Secondary | ICD-10-CM

## 2019-10-18 DIAGNOSIS — R002 Palpitations: Secondary | ICD-10-CM | POA: Diagnosis not present

## 2019-10-18 MED ORDER — HYDROXYZINE HCL 10 MG PO TABS: 10.0000 mg | ORAL_TABLET | Freq: Three times a day (TID) | ORAL | 1 refills | Status: DC | PRN

## 2019-10-18 MED ORDER — LOSARTAN POTASSIUM 25 MG PO TABS: 25.0000 mg | ORAL_TABLET | Freq: Every day | ORAL | 3 refills | Status: DC

## 2019-10-18 NOTE — Progress Notes (Signed)
  Subjective:     Patient ID: Rachel Higgins, female   DOB: Feb 23, 1991, 29 y.o.   MRN: [id number]  HPI Rachel Higgins is a 29 year old white female, married, G0P0 in for BP check and follow up on starting Zoloft.    Review of Systems Does not feel sad or happy,feels  like nothing Had panic attack Sunday  Has had palpitations Reviewed past medical,surgical, social and family history. Reviewed medications and allergies.     Objective:   Physical Exam BP (!) 141/90 (BP Location: Left Arm, Patient Position: Sitting, Cuff Size: Normal)   Pulse (!) 116   Ht 5\' 2"  (1.575 m)   Wt 218 lb (98.9 kg)   BMI 39.87 kg/m  Skin warm and dry. Lungs: clear to ausculation bilaterally. Cardiovascular: regular rate and rhythm. PHQ 9 score is 14, no SI,score was 10 in July. She dis lose about 3/4 lb.      Assessment:     1. Hypertension, unspecified type Will stop norvasc had palpitations and will rx losartan Meds ordered this encounter  Medications  . losartan (COZAAR) 25 MG tablet    Sig: Take 1 tablet (25 mg total) by mouth daily.    Dispense:  30 tablet    Refill:  3    Order Specific Question:   Supervising Provider    Answer:   August, LUTHER H [[id number]]  . hydrOXYzine (ATARAX/VISTARIL) 10 MG tablet    Sig: Take 1 tablet (10 mg total) by mouth 3 (three) times daily as needed.    Dispense:  30 tablet    Refill:  1    Order Specific Question:   Supervising Provider    Answer:   Despina Hidden, LUTHER H [[id number]]  Continue weight loss efforts   2. Anxiety and depression -continue Zoloft Will add vistaril prn   3. Palpitations     Plan:     Follow up in 4 weeks

## 2019-11-15 ENCOUNTER — Ambulatory Visit: Payer: Commercial Managed Care - PPO | Admitting: Adult Health

## 2019-11-15 ENCOUNTER — Encounter: Payer: Self-pay | Admitting: Adult Health

## 2019-11-15 VITALS — BP 132/81 | HR 87 | Ht 62.4 in | Wt 218.4 lb

## 2019-11-15 DIAGNOSIS — F329 Major depressive disorder, single episode, unspecified: Secondary | ICD-10-CM | POA: Diagnosis not present

## 2019-11-15 DIAGNOSIS — I10 Essential (primary) hypertension: Secondary | ICD-10-CM | POA: Diagnosis not present

## 2019-11-15 DIAGNOSIS — G44209 Tension-type headache, unspecified, not intractable: Secondary | ICD-10-CM | POA: Diagnosis not present

## 2019-11-15 DIAGNOSIS — F419 Anxiety disorder, unspecified: Secondary | ICD-10-CM | POA: Diagnosis not present

## 2019-11-15 DIAGNOSIS — F32A Depression, unspecified: Secondary | ICD-10-CM

## 2019-11-15 NOTE — Progress Notes (Signed)
  Subjective:     Patient ID: Rachel Higgins, female   DOB: 1991/01/11, 29 y.o.   MRN: [id number]  HPI Rachel Higgins is a 29 year old white female, married, G0P0 in for follow up on BP and anxiety and depression and feels better.   Review of Systems +headache, feels in neck area Reviewed past medical,surgical, social and family history. Reviewed medications and allergies.     Objective:   Physical Exam BP 132/81 (BP Location: Right Arm, Patient Position: Sitting, Cuff Size: Large)   Pulse 87   Ht 5' 2.4" (1.585 m)   Wt 218 lb 6.4 oz (99.1 kg)   LMP 11/04/2019   BMI 39.44 kg/m  Skin warm and dry. Lungs: clear to ausculation bilaterally. Cardiovascular: regular rate and rhythm. PHQ 9 score is 16, which is 2 points higher than in August but she says she feels better    Assessment:     1. Anxiety and depression Continue zoloft and vistril  2. Tension headache See chiropractor or massage theraptist   3. Hypertension, unspecified type Continue lasartan    Plan:     Follow up in 7 weeks

## 2020-01-03 ENCOUNTER — Ambulatory Visit: Payer: Commercial Managed Care - PPO | Admitting: Adult Health

## 2020-01-15 ENCOUNTER — Other Ambulatory Visit: Payer: Self-pay

## 2020-01-15 ENCOUNTER — Ambulatory Visit (INDEPENDENT_AMBULATORY_CARE_PROVIDER_SITE_OTHER): Payer: Commercial Managed Care - PPO | Admitting: Adult Health

## 2020-01-15 ENCOUNTER — Encounter: Payer: Self-pay | Admitting: Adult Health

## 2020-01-15 VITALS — BP 142/94 | HR 96 | Ht 62.0 in | Wt 218.5 lb

## 2020-01-15 DIAGNOSIS — Z319 Encounter for procreative management, unspecified: Secondary | ICD-10-CM | POA: Insufficient documentation

## 2020-01-15 DIAGNOSIS — R7309 Other abnormal glucose: Secondary | ICD-10-CM | POA: Diagnosis not present

## 2020-01-15 DIAGNOSIS — F419 Anxiety disorder, unspecified: Secondary | ICD-10-CM

## 2020-01-15 DIAGNOSIS — F32A Depression, unspecified: Secondary | ICD-10-CM

## 2020-01-15 DIAGNOSIS — E559 Vitamin D deficiency, unspecified: Secondary | ICD-10-CM

## 2020-01-15 DIAGNOSIS — E7800 Pure hypercholesterolemia, unspecified: Secondary | ICD-10-CM | POA: Diagnosis not present

## 2020-01-15 DIAGNOSIS — R5383 Other fatigue: Secondary | ICD-10-CM

## 2020-01-15 DIAGNOSIS — I10 Essential (primary) hypertension: Secondary | ICD-10-CM

## 2020-01-15 HISTORY — DX: Encounter for procreative management, unspecified: Z31.9

## 2020-01-15 MED ORDER — SERTRALINE HCL 25 MG PO TABS: ORAL_TABLET | ORAL | 6 refills | Status: DC

## 2020-01-15 MED ORDER — LOSARTAN POTASSIUM 50 MG PO TABS: 50.0000 mg | ORAL_TABLET | Freq: Every day | ORAL | 6 refills | Status: DC

## 2020-01-15 NOTE — Progress Notes (Addendum)
  Subjective:     Patient ID: Rachel Higgins, female   DOB: December 15, 1990, 29 y.o.   MRN: [id number]  HPI Rachel Higgins is a 29 year old white female,married, G0P0,back in follow up on BP and anxiety and depression. She wants to get pregnant and is open to referral to infertility center.    Review of Systems Periods are mostly regular Has occasional headache +tired  She has drainage at navel at times, none today  Reviewed past medical,surgical, social and family history. Reviewed medications and allergies.     Objective:   Physical Exam BP (!) 142/94 (BP Location: Left Arm, Patient Position: Sitting, Cuff Size: Large)   Pulse 96   Ht 5\' 2"  (1.575 m)   Wt 218 lb 8 oz (99.1 kg)   LMP 12/31/2019   BMI 39.96 kg/m  Skin warm and dry.  Lungs: clear to ausculation bilaterally. Cardiovascular: regular rate and rhythm. PHQ 9 score is 13, no SI   [facility] - 01/15/20 1610      Pregnancy Intention Screening   Does the patient want to become pregnant in the next year? Yes    Does the patient's partner want to become pregnant in the next year? Yes    Would the patient like to discuss contraceptive options today? No      Contraception Wrap Up   Current Method Pregnant/Seeking Pregnancy    End Method Pregnant/Seeking Pregnancy    Contraception Counseling Provided No             Assessment:     1. Anxiety and depression Will increase Zoloft to 75 mg She declines counseling She has vistaril and it does help with sleep   Meds ordered this encounter  Medications  . sertraline (ZOLOFT) 25 MG tablet    Sig: Take 3 tablets daily to equal 75 mg    Dispense:  90 tablet    Refill:  6    Order Specific Question:   Supervising Provider    Answer:   01/17/20, LUTHER H [[id number]]  . losartan (COZAAR) 50 MG tablet    Sig: Take 1 tablet (50 mg total) by mouth daily.    Dispense:  30 tablet    Refill:  6    Order Specific Question:   Supervising Provider    Answer:   Despina Hidden, LUTHER H [[id number]]    2. Elevated  cholesterol Check lipids,to get fasting   3. Elevated hemoglobin A1c Check A1c  4. Vitamin D deficiency Check Vitamin D  5. Tired Check CBC,CMP and TSH  6. Hypertension, unspecified type Will increase losartan to 50 mg    7. Patient desires pregnancy Given number for [facility]     Plan:    Try to dry navel good with Q tip after shower, and call me for check when drainage present Try to decrease smoking Follow up in 8 weeks

## 2020-01-22 LAB — TSH: TSH: 2.07 u[IU]/mL (ref 0.450–4.500)

## 2020-01-22 LAB — COMPREHENSIVE METABOLIC PANEL
ALT: 26 IU/L (ref 0–32)
AST: 23 IU/L (ref 0–40)
Albumin/Globulin Ratio: 1.5 (ref 1.2–2.2)
Albumin: 4.2 g/dL (ref 3.8–4.8)
Alkaline Phosphatase: 78 IU/L (ref 44–121)
BUN/Creatinine Ratio: 13 (ref 9–23)
BUN: 9 mg/dL (ref 6–20)
Bilirubin Total: <0.2 mg/dL (ref 0.0–1.2)
CO2: 22 mmol/L (ref 20–29)
Calcium: 9.8 mg/dL (ref 8.7–10.2)
Chloride: 100 mmol/L (ref 96–106)
Creatinine, Ser: 0.72 mg/dL (ref 0.57–1.00)
GFR calc Af Amer: 129 mL/min/{1.73_m2} (ref >59)
GFR calc non Af Amer: 112 mL/min/{1.73_m2} (ref >59)
Globulin, Total: 2.8 g/dL (ref 1.5–4.5)
Glucose: 113 mg/dL — ABNORMAL HIGH (ref 65–99)
Potassium: 4.6 mmol/L (ref 3.5–5.2)
Sodium: 138 mmol/L (ref 134–144)
Total Protein: 7 g/dL (ref 6.0–8.5)

## 2020-01-22 LAB — CBC
Hematocrit: 41.3 % (ref 34.0–46.6)
Hemoglobin: 13.5 g/dL (ref 11.1–15.9)
MCH: 28.9 pg (ref 26.6–33.0)
MCHC: 32.7 g/dL (ref 31.5–35.7)
MCV: 88 fL (ref 79–97)
Platelets: 391 10*3/uL (ref 150–450)
RBC: 4.67 x10E6/uL (ref 3.77–5.28)
RDW: 13.8 % (ref 11.7–15.4)
WBC: 12.2 10*3/uL — ABNORMAL HIGH (ref 3.4–10.8)

## 2020-01-22 LAB — HEMOGLOBIN A1C
Est. average glucose Bld gHb Est-mCnc: 128 mg/dL
Hgb A1c MFr Bld: 6.1 % — ABNORMAL HIGH (ref 4.8–5.6)

## 2020-01-22 LAB — LIPID PANEL
Chol/HDL Ratio: 6.2 ratio — ABNORMAL HIGH (ref 0.0–4.4)
Cholesterol, Total: 234 mg/dL — ABNORMAL HIGH (ref 100–199)
HDL: 38 mg/dL — ABNORMAL LOW (ref >39)
LDL Chol Calc (NIH): 170 mg/dL — ABNORMAL HIGH (ref 0–99)
Triglycerides: 140 mg/dL (ref 0–149)
VLDL Cholesterol Cal: 26 mg/dL (ref 5–40)

## 2020-01-22 LAB — VITAMIN D 25 HYDROXY (VIT D DEFICIENCY, FRACTURES): Vit D, 25-Hydroxy: 27.2 ng/mL — ABNORMAL LOW (ref 30.0–100.0)

## 2020-02-07 ENCOUNTER — Encounter: Payer: Self-pay | Admitting: Adult Health

## 2020-03-03 ENCOUNTER — Ambulatory Visit: Payer: Commercial Managed Care - PPO | Admitting: Adult Health

## 2020-03-11 ENCOUNTER — Other Ambulatory Visit: Payer: Self-pay

## 2020-03-11 ENCOUNTER — Encounter: Payer: Self-pay | Admitting: Adult Health

## 2020-03-11 ENCOUNTER — Ambulatory Visit: Payer: Commercial Managed Care - PPO | Admitting: Adult Health

## 2020-03-11 VITALS — BP 139/84 | HR 93 | Ht 62.25 in | Wt 219.2 lb

## 2020-03-11 DIAGNOSIS — F419 Anxiety disorder, unspecified: Secondary | ICD-10-CM | POA: Diagnosis not present

## 2020-03-11 DIAGNOSIS — F32A Depression, unspecified: Secondary | ICD-10-CM | POA: Diagnosis not present

## 2020-03-11 DIAGNOSIS — I10 Essential (primary) hypertension: Secondary | ICD-10-CM

## 2020-03-11 NOTE — Progress Notes (Signed)
  Subjective:     Patient ID: Rachel Higgins, female   DOB: 08/08/1990, 30 y.o.   MRN: [id number]  HPI Rachel Higgins is a 30 year old white female, married, G0P0, back in follow up on increasing Zoloft to 75 mg and losartan to 50 mg  .   Review of Systems Feels better Navel drained again some last week, seem to happen if IBS flares  Reviewed past medical,surgical, social and family history. Reviewed medications and allergies.     Objective:   Physical Exam BP 139/84 (BP Location: Right Arm, Patient Position: Sitting, Cuff Size: Large)   Pulse 93   Ht 5' 2.25" (1.581 m)   Wt 219 lb 3.2 oz (99.4 kg)   LMP 02/21/2020 (Exact Date)   BMI 39.77 kg/m  Skin warm and dry.. Lungs: clear to ausculation bilaterally. Cardiovascular: regular rate and rhythm. PHQ 9 score is 12, which is a little better, on 75 mg zoloft  [facility] - 03/11/20 1618      Pregnancy Intention Screening   Does the patient want to become pregnant in the next year? Yes    Does the patient's partner want to become pregnant in the next year? Yes    Would the patient like to discuss contraceptive options today? No      Contraception Wrap Up   Current Method Pregnant/Seeking Pregnancy    End Method Pregnant/Seeking Pregnancy    Contraception Counseling Provided No         she has not called [facility] yet.    Assessment:     1. Anxiety and depression Continue zoloft 75 mg has refills  2. Hypertension, unspecified type Continue losartan 50 mg has refills Keep working on losing some weight, try [employer]     Plan:     Return in 3 months for ROS and labs, lipids, A1c and CMP

## 2020-03-26 ENCOUNTER — Other Ambulatory Visit: Payer: Self-pay | Admitting: Adult Health

## 2020-06-09 ENCOUNTER — Ambulatory Visit: Payer: Commercial Managed Care - PPO | Admitting: Adult Health

## 2020-08-03 ENCOUNTER — Other Ambulatory Visit: Payer: Self-pay | Admitting: Adult Health

## 2020-08-18 ENCOUNTER — Other Ambulatory Visit: Payer: Self-pay | Admitting: Adult Health

## 2020-09-04 ENCOUNTER — Other Ambulatory Visit: Payer: Self-pay | Admitting: Adult Health

## 2020-09-22 ENCOUNTER — Other Ambulatory Visit: Payer: Self-pay | Admitting: Adult Health

## 2020-10-06 ENCOUNTER — Other Ambulatory Visit: Payer: Self-pay | Admitting: Adult Health

## 2020-10-08 ENCOUNTER — Encounter: Payer: Self-pay | Admitting: Adult Health

## 2020-10-08 ENCOUNTER — Other Ambulatory Visit: Payer: Self-pay

## 2020-10-08 ENCOUNTER — Ambulatory Visit: Payer: Self-pay | Admitting: Adult Health

## 2020-10-08 VITALS — BP 125/75 | HR 91 | Ht 62.25 in | Wt 230.0 lb

## 2020-10-08 DIAGNOSIS — F419 Anxiety disorder, unspecified: Secondary | ICD-10-CM

## 2020-10-08 DIAGNOSIS — R7309 Other abnormal glucose: Secondary | ICD-10-CM

## 2020-10-08 DIAGNOSIS — I10 Essential (primary) hypertension: Secondary | ICD-10-CM

## 2020-10-08 DIAGNOSIS — E7800 Pure hypercholesterolemia, unspecified: Secondary | ICD-10-CM

## 2020-10-08 DIAGNOSIS — F32A Depression, unspecified: Secondary | ICD-10-CM

## 2020-10-08 DIAGNOSIS — Z01419 Encounter for gynecological examination (general) (routine) without abnormal findings: Secondary | ICD-10-CM

## 2020-10-08 DIAGNOSIS — N644 Mastodynia: Secondary | ICD-10-CM | POA: Insufficient documentation

## 2020-10-08 MED ORDER — SERTRALINE HCL 25 MG PO TABS: 75.0000 mg | ORAL_TABLET | Freq: Every day | ORAL | 12 refills | Status: DC

## 2020-10-08 MED ORDER — LOSARTAN POTASSIUM 50 MG PO TABS: ORAL_TABLET | ORAL | 12 refills | Status: DC

## 2020-10-08 NOTE — Progress Notes (Signed)
Patient ID: Rachel Higgins, female   DOB: 04-11-1990, 30 y.o.   MRN: [id number] History of Present Illness: Rachel Higgins is a 30 year old white female,married, G0P0, in for a well woman gyn exam. She has changed jobs and looks much happier. She is self pay today, will have insurance in October. Lab Results  Component Value Date   DIAGPAP  08/30/2018    NEGATIVE FOR INTRAEPITHELIAL LESIONS OR MALIGNANCY.   HPV NOT DETECTED 08/30/2018      Current Medications, Allergies, Past Medical History, Past Surgical History, Family History and Social History were reviewed in [employer] record.     Review of Systems:  Patient denies any headaches, hearing loss, fatigue, blurred vision, shortness of breath, chest pain, abdominal pain, problems with bowel movements, urination, or intercourse. No joint pain or mood swings.  Has had pain in left breast on and off since January, recently swelled and really hurt also like heart attack,no heat or redness.    Physical Exam:BP 125/75 (BP Location: Left Arm, Patient Position: Sitting, Cuff Size: Large)   Pulse 91   Ht 5' 2.25" (1.581 m)   Wt 230 lb (104.3 kg)   LMP 09/11/2020   BMI 41.73 kg/m   General:  Well developed, well nourished, no acute distress Skin:  Warm and dry Neck:  Midline trachea, normal thyroid, good ROM, no lymphadenopathy Lungs; Clear to auscultation bilaterally Breast:  No dominant palpable mass, retraction, or nipple discharge, on point tenderness left breast at 1 0' clock about 4 FB from areola, no mass felt Cardiovascular: Regular rate and rhythm Abdomen:  Soft, non tender, no hepatosplenomegaly Pelvic:  External genitalia is normal in appearance, no lesions.  The vagina is normal in appearance. Urethra has no lesions or masses. The cervix is smooth.  Uterus is felt to be normal size, shape, and contour.  No adnexal masses or tenderness noted.Bladder is non tender, no masses  felt. Rectal:Deferred Extremities/musculoskeletal:  No swelling or varicosities noted, no clubbing or cyanosis Psych:  No mood changes, alert and cooperative,seems happy AA is 0 Fall risk is high Depression screen [facility] 2/9 10/08/2020 03/11/2020 01/15/2020  Decreased Interest 1 1 1   Down, Depressed, Hopeless 1 0 1  PHQ - 2 Score 2 1 2   Altered sleeping 3 3 3   Tired, decreased energy 3 3 3   Change in appetite 1 0 1  Feeling bad or failure about yourself  1 0 -  Trouble concentrating 2 3 1   Moving slowly or fidgety/restless 2 2 3   Suicidal thoughts 0 0 0  PHQ-9 Score 14 12 13   Difficult doing work/chores - - Somewhat difficult    GAD 7 : Generalized Anxiety Score 10/08/2020 01/15/2020 09/06/2019  Nervous, Anxious, on Edge 2 1 2   Control/stop worrying 1 1 2   Worry too much - different things 1 1 2   Trouble relaxing 2 2 1   Restless 1 3 0  Easily annoyed or irritable 1 0 2  Afraid - awful might happen 1 0 1  Total GAD 7 Score 9 8 10   Anxiety Difficulty - Not difficult at all -  She is on 75 mg of zoloft says she is good  Upstream - 10/08/20 1433       Pregnancy Intention Screening   Does the patient want to become pregnant in the next year? Yes    Does the patient's partner want to become pregnant in the next year? Yes    Would the patient like to discuss contraceptive  options today? No      Contraception Wrap Up   Current Method Pregnant/Seeking Pregnancy    End Method Pregnant/Seeking Pregnancy    Contraception Counseling Provided No            Examination chaperoned by Malachy Mood LPN     Impression and Plan: 1. Encounter for well woman exam with routine gynecological exam Pap and physical in 1 year  2. Anxiety and depression Continue zoloft 75 mg  Meds ordered this encounter  Medications   losartan (COZAAR) 50 MG tablet    Sig: TAKE 1 TABLET BY MOUTH ONCE DAILY - TIME FOR LABS    Dispense:  30 tablet    Refill:  12    Order Specific Question:   Supervising  Provider    Answer:   Duane Lope H [[id number]]   sertraline (ZOLOFT) 25 MG tablet    Sig: Take 3 tablets (75 mg total) by mouth daily.    Dispense:  90 tablet    Refill:  12    Order Specific Question:   Supervising Provider    Answer:   Despina Hidden, LUTHER H [[id number]]     3. Breast pain, left Scheduled diagnostic mammogram and rt and left [name] if needed. at [facility] 12/09/20 at 3:40 pm (she wanted to wait til insurance in effect) - US BREAST LTD UNI RIGHT INC AXILLA; Future - MM DIAG BREAST TOMO BILATERAL; Future - US BREAST LTD UNI LEFT INC AXILLA; Future  4. Elevated hemoglobin A1c Will check labs in October when has insurance  - Hemoglobin A1c  5. Elevated cholesterol  - Comprehensive metabolic panel - Lipid panel  6. Hypertension, unspecified type Continue losartan - Comprehensive metabolic panel  Follow up in 6 months

## 2020-10-20 ENCOUNTER — Other Ambulatory Visit: Payer: Self-pay | Admitting: Adult Health

## 2020-12-09 ENCOUNTER — Ambulatory Visit (HOSPITAL_COMMUNITY)
Admission: RE | Admit: 2020-12-09 | Discharge: 2020-12-09 | Disposition: A | Discharge status: Home / Self Care | Payer: 59 | Source: Ambulatory Visit | Attending: Adult Health | Admitting: Adult Health

## 2020-12-09 ENCOUNTER — Other Ambulatory Visit: Payer: Self-pay

## 2020-12-09 DIAGNOSIS — N644 Mastodynia: Secondary | ICD-10-CM | POA: Diagnosis not present

## 2021-01-01 LAB — LIPID PANEL
Chol/HDL Ratio: 6.8 ratio — ABNORMAL HIGH (ref 0.0–4.4)
Cholesterol, Total: 239 mg/dL — ABNORMAL HIGH (ref 100–199)
HDL: 35 mg/dL — ABNORMAL LOW (ref >39)
LDL Chol Calc (NIH): 166 mg/dL — ABNORMAL HIGH (ref 0–99)
Triglycerides: 204 mg/dL — ABNORMAL HIGH (ref 0–149)
VLDL Cholesterol Cal: 38 mg/dL (ref 5–40)

## 2021-01-01 LAB — COMPREHENSIVE METABOLIC PANEL
ALT: 26 IU/L (ref 0–32)
AST: 26 IU/L (ref 0–40)
Albumin/Globulin Ratio: 1.7 (ref 1.2–2.2)
Albumin: 4.3 g/dL (ref 3.8–4.8)
Alkaline Phosphatase: 73 IU/L (ref 44–121)
BUN/Creatinine Ratio: 8 — ABNORMAL LOW (ref 9–23)
BUN: 6 mg/dL (ref 6–20)
Bilirubin Total: 0.3 mg/dL (ref 0.0–1.2)
CO2: 24 mmol/L (ref 20–29)
Calcium: 9.5 mg/dL (ref 8.7–10.2)
Chloride: 100 mmol/L (ref 96–106)
Creatinine, Ser: 0.77 mg/dL (ref 0.57–1.00)
Globulin, Total: 2.6 g/dL (ref 1.5–4.5)
Glucose: 127 mg/dL — ABNORMAL HIGH (ref 70–99)
Potassium: 4.5 mmol/L (ref 3.5–5.2)
Sodium: 138 mmol/L (ref 134–144)
Total Protein: 6.9 g/dL (ref 6.0–8.5)
eGFR: 105 mL/min/{1.73_m2} (ref >59)

## 2021-01-01 LAB — HEMOGLOBIN A1C
Est. average glucose Bld gHb Est-mCnc: 143 mg/dL
Hgb A1c MFr Bld: 6.6 % — ABNORMAL HIGH (ref 4.8–5.6)

## 2021-01-02 ENCOUNTER — Telehealth: Payer: Self-pay | Admitting: Adult Health

## 2021-01-02 DIAGNOSIS — E119 Type 2 diabetes mellitus without complications: Secondary | ICD-10-CM

## 2021-01-02 DIAGNOSIS — E7800 Pure hypercholesterolemia, unspecified: Secondary | ICD-10-CM

## 2021-01-02 MED ORDER — SIMVASTATIN 10 MG PO TABS: 10.0000 mg | ORAL_TABLET | Freq: Every day | ORAL | 3 refills | Status: DC

## 2021-01-02 MED ORDER — METFORMIN HCL 500 MG PO TABS: 500.0000 mg | ORAL_TABLET | Freq: Two times a day (BID) | ORAL | 3 refills | Status: DC

## 2021-01-02 NOTE — Telephone Encounter (Signed)
Pt aware of labs, A1c 6.6 and cholesterol elevated, will rx zocor and metformin and refer to nutrition and start exercising more and decrease sugar and fats, try to lose weight, and follow up in 3 months for ROS and labs, has not had periods in 2 months but negative HPT

## 2021-01-06 ENCOUNTER — Other Ambulatory Visit: Payer: Self-pay | Admitting: Adult Health

## 2021-01-06 MED ORDER — BLOOD GLUCOSE MONITOR KIT: PACK | 0 refills | Status: AC

## 2021-01-06 NOTE — Progress Notes (Unsigned)
Will rx glucometer

## 2021-01-28 ENCOUNTER — Other Ambulatory Visit: Payer: Self-pay | Admitting: Adult Health

## 2021-02-16 ENCOUNTER — Other Ambulatory Visit: Payer: Self-pay | Admitting: Adult Health

## 2021-02-17 ENCOUNTER — Other Ambulatory Visit: Payer: Self-pay

## 2021-02-17 ENCOUNTER — Encounter: Payer: 59 | Attending: Adult Health | Admitting: Nutrition

## 2021-02-17 ENCOUNTER — Encounter: Payer: Self-pay | Admitting: Nutrition

## 2021-02-17 VITALS — Ht 62.0 in | Wt 232.0 lb

## 2021-02-17 DIAGNOSIS — E559 Vitamin D deficiency, unspecified: Secondary | ICD-10-CM | POA: Diagnosis present

## 2021-02-17 DIAGNOSIS — E782 Mixed hyperlipidemia: Secondary | ICD-10-CM | POA: Diagnosis present

## 2021-02-17 DIAGNOSIS — I10 Essential (primary) hypertension: Secondary | ICD-10-CM | POA: Diagnosis present

## 2021-02-17 DIAGNOSIS — E118 Type 2 diabetes mellitus with unspecified complications: Secondary | ICD-10-CM | POA: Insufficient documentation

## 2021-02-17 DIAGNOSIS — E7800 Pure hypercholesterolemia, unspecified: Secondary | ICD-10-CM | POA: Insufficient documentation

## 2021-02-17 NOTE — Patient Instructions (Signed)
Goals Established by Pt Eat three balanced meals per day. Don't skip meals Increase high fiber foods to help lower your cholesterol Cut out processed meats and fast foods Increase fruit and vegetables. Walk 30 minutes a day Test blood sugars before breakfast and bedtime. Focus on eating more foods out of a garden-plant based foods. Lose 1 lb per week.

## 2021-02-17 NOTE — Progress Notes (Signed)
Medical Nutrition Therapy  Appointment Start time:  0800  Appointment End time:  0915  Primary concerns today: Diabetes Type 2  Referral diagnosis: E11.8 Preferred learning style: visuals  Learning readiness: Ready    NUTRITION ASSESSMENT  Her with her husband. Notes she was prediabetic for a long time and now has DM Type 2. Only sees her OBGYN. No PCP right now. She notes she has cut down on carbs drastically. Diet is high in saturated fats. Testing fasting, before lunch after lunch and dinner. FBS 107-115's. After meals 1 hr less than 120's  Wants to lose weight and get her health back and get off medications. Supportive husband. Smoker, not ready to quit right now. Eats 2 meals per day, usually skips breakfast Eats late at night also.  Anthropometrics    Wt Readings from Last 3 Encounters:  02/17/21 232 lb (105.2 kg)  10/08/20 230 lb (104.3 kg)  03/11/20 219 lb 3.2 oz (99.4 kg)   Ht Readings from Last 3 Encounters:  02/17/21 5\' 2"  (1.575 m)  10/08/20 5' 2.25" (1.581 m)  03/11/20 5' 2.25" (1.581 m)   Body mass index is 42.43 kg/m. @BMIFA @ Facility age limit for growth percentiles is 20 years. Facility age limit for growth percentiles is 20 years.   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 Eats at home. Eats fast food 2-3 times per week. Likes processed meats-especially sausage.  Estimated daily fluid intake: 80  oz Supplements: VIt D, B12 Sleep: 8 hrs  Stress / self-care: none Current average weekly physical activity: ADL  24-Hr Dietary Recall First Meal: 2 eggs, 3 sausage links, cheese, ice coffee-creamer sugar Snack:  Second Meal: Chicken caesar salad, cheetos 100 calorie pack, water Snack: 2 sugar cookies thumbprint, water Third Meal: Pork loin roast, green beans, water Snack: sweet tea Beverages: water,   Estimated Energy Needs Calories: 1200 Carbohydrate: 135 g Protein: 90 g Fat: 33 g   NUTRITION DIAGNOSIS  NB-1.1 Food and nutrition-related knowledge deficit As related to Diabetes Type 2.  As evidenced by A1C 6.6%.   NUTRITION INTERVENTION  Nutrition education (E-1) on the following topics:  Nutrition and Diabetes education provided on My Plate, CHO counting, meal planning, portion sizes, timing of meals, avoiding snacks between meals unless having a low blood sugar, target ranges for A1C and blood sugars, signs/symptoms and treatment of hyper/hypoglycemia, monitoring blood sugars, taking medications as prescribed, benefits of exercising 30 minutes per day and prevention of complications of DM.  Lifestyle Medicine - Whole Food, Plant Predominant Nutrition is highly recommended: Eat Plenty of vegetables, Mushrooms, fruits, Legumes, Whole Grains, Nuts, seeds in lieu of processed meats, processed snacks/pastries red meat, poultry, eggs.    -It is better to avoid simple carbohydrates including: Cakes, Sweet Desserts, Ice Cream, Soda (diet and regular), Sweet Tea, Candies, Chips, Cookies, Store Bought Juices, Alcohol in Excess of  1-2 drinks  a day, Lemonade,  Artificial Sweeteners, Doughnuts, Coffee Creamers, "Sugar-free" Products, etc, etc.  This is not a complete list.....  Exercise: If you are able: 30 -60 minutes a day ,4 days a week, or 150 minutes a week.  The longer the better.  Combine stretch, strength, and aerobic activities.  If you were told in the past that you have high risk  for cardiovascular diseases, you may seek evaluation by your heart doctor prior to initiating moderate to intense exercise programs.  Handouts Provided Include  LIvestyle nutrition Diabetes and You booklet Meal Plan Card  Learning Style & Readiness for Change Teaching method utilized: Visual & Auditory  Demonstrated degree of understanding via: Teach Back  Barriers to learning/adherence to lifestyle change: none  Goals Established by Pt Eat three balanced meals per day. Don't skip meals Increase high fiber foods to help lower your cholesterol Cut out processed meats and fast foods Increase fruit and vegetables. Walk 30 minutes a day Test blood sugars before breakfast and bedtime. Focus on eating more foods out of a garden-plant based foods. Lose 1 lb per week.  MONITORING & EVALUATION Dietary intake, weekly physical activity, and blood sugars and weight in 1 month.  Next Steps  Patient is to work on meal planning and eating more plant based foods.Marland Kitchen

## 2021-03-06 ENCOUNTER — Other Ambulatory Visit: Payer: Self-pay | Admitting: Adult Health

## 2021-03-30 ENCOUNTER — Encounter: Payer: 59 | Attending: Adult Health | Admitting: Nutrition

## 2021-03-30 ENCOUNTER — Other Ambulatory Visit: Payer: Self-pay

## 2021-03-30 ENCOUNTER — Encounter: Payer: Self-pay | Admitting: Nutrition

## 2021-03-30 VITALS — Ht 62.0 in | Wt 229.8 lb

## 2021-03-30 DIAGNOSIS — E7800 Pure hypercholesterolemia, unspecified: Secondary | ICD-10-CM | POA: Insufficient documentation

## 2021-03-30 DIAGNOSIS — I10 Essential (primary) hypertension: Secondary | ICD-10-CM | POA: Insufficient documentation

## 2021-03-30 DIAGNOSIS — E118 Type 2 diabetes mellitus with unspecified complications: Secondary | ICD-10-CM | POA: Diagnosis present

## 2021-03-30 DIAGNOSIS — E559 Vitamin D deficiency, unspecified: Secondary | ICD-10-CM | POA: Diagnosis present

## 2021-03-30 NOTE — Progress Notes (Signed)
Medical Nutrition Therapy  DM Follow up Appointment Start time:  0800  Appointment End time:  0830 Learning readiness: Ready    NUTRITION ASSESSMENT DM Follow up Has lost 3 lbs.  Feels less bloated. Avoiding dairy and most gluten foods. Metformin 500 mg BID. Has been cutting out sweets. Eating more of a balanced meals. Not eating after after 8 pm due to her work schedule. FBS 101-120's  bedtime: 86-120's Drinking 80 oz of water. Cutting back on sweet tea and diluting it to be more like unsweet tea. Has cut out sugar in coffee. Still smoking 10 cigarettes. Still having issuees with anxiety often. Would like to talk to provider about possible anxiety medications. Would like referral to counselor. Enjoying her 30 minutes of her exercise daily after work.  30 day avg BS was 112 mg/dl. To see OBGYN in a week and will get A1C done then most likely.  Goals previously set:  Eat three balanced meals per day.-done well. Don't skip meals-done Increase high fiber foods to help lower your cholesterol-done Cut out processed meats and fast foods-done Increase fruit and vegetables.-done Walk 30 minutes a day-using a fitbit Test blood sugars before breakfast and bedtime. Focus on eating more foods out of a garden-plant based foods. Done  Lose 1 lb per week.-lost 2 lbs in the last month.   Anthropometrics    Wt Readings from Last 3 Encounters:  02/17/21 232 lb (105.2 kg)  10/08/20 230 lb (104.3 kg)  03/11/20 219 lb 3.2 oz (99.4 kg)   Ht Readings from Last 3 Encounters:  02/17/21 5\' 2"  (1.575 m)  10/08/20 5' 2.25" (1.581 m)  03/11/20 5' 2.25" (1.581 m)   There is no height or weight on file to calculate BMI. @BMIFA @ Facility age limit for growth percentiles is 20 years. Facility age limit for growth percentiles is 20 years.   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 Eats at home. Eats fast food 2-3 times per week. Likes processed meats-especially sausage.  Estimated daily fluid intake: 80 oz Supplements: VIt D, B12 Sleep: 8 hrs  Stress / self-care: none Current average weekly physical activity: ADL  24-Hr Dietary Recall First Meal: 2 eggs,Toast 1 slice Second Meal: PB sandwich thins, water Snack: r Third Meal: roasted chicken, vegetable, wild rice.water, Snack:  Beverages: water,   Estimated Energy Needs Calories: 1200 Carbohydrate: 135 g Protein: 90 g Fat: 33 g   NUTRITION DIAGNOSIS  NB-1.1 Food and nutrition-related knowledge deficit As related to Diabetes Type 2.  As evidenced by A1C 6.6%.   NUTRITION INTERVENTION  Nutrition education (E-1) on the following topics:   Lifestyle Medicine - Whole Food, Plant Predominant Nutrition is highly recommended: Eat Plenty of vegetables, Mushrooms, fruits, Legumes, Whole Grains, Nuts, seeds in lieu of processed meats, processed snacks/pastries red meat, poultry, eggs.    -It is better to avoid simple carbohydrates  including: Cakes, Sweet Desserts, Ice Cream, Soda (diet and regular), Sweet Tea, Candies, Chips, Cookies, Store Bought Juices, Alcohol in Excess of  1-2 drinks a day, Lemonade,  Artificial Sweeteners, Doughnuts, Coffee Creamers, "Sugar-free" Products, etc, etc.  This is not a complete list.....  Exercise: If you are able: 30 -60 minutes a day ,4 days a week, or 150 minutes a week.  The longer the better.  Combine stretch, strength, and aerobic  activities.  If you were told in the past that you have high risk for cardiovascular diseases, you may seek evaluation by your heart doctor prior to initiating moderate to intense exercise programs.  Handouts Provided Include  LIvestyle nutrition Diabetes and You booklet Meal Plan Card  Learning Style & Readiness for Change Teaching method utilized: Visual & Auditory  Demonstrated degree of understanding via: Teach Back  Barriers to learning/adherence to lifestyle change: none  Goals Established by Pt Keep up the great job!! Talk to provider about referral for counseling. MIgh also ask aboiut medication for anxiety. Continue to exercise 40 minutes a day. Keep working on high fiber foods. Lose 1 lb per week. Dr. Nevada Crane or [facility] Primary care is taking new patients. (Dr. Posey Pronto) is good. Keep up the great job. Keep working on high fiber foods and more plant based foods.  MONITORING & EVALUATION Dietary intake, weekly physical activity, and blood sugars and weight in 4-5 month.  Next Steps  Patient is to work on meal planning and eating more plant based foods.Marland Kitchen

## 2021-03-30 NOTE — Patient Instructions (Addendum)
Goals Talk to provider about referral for counseling. MIgh also ask aboiut medication for anxiety. Continue to exercise 40 minutes a day. Keep working on high fiber foods. Lose 1 lb per week. Dr. Margo Aye or [facility] Primary care is taking new patients. (Dr. Allena Katz) is good. Keep up the great job. Keep working on high fiber foods and more plant based foods.

## 2021-04-10 ENCOUNTER — Encounter: Payer: Self-pay | Admitting: Adult Health

## 2021-04-10 ENCOUNTER — Other Ambulatory Visit: Payer: Self-pay

## 2021-04-10 ENCOUNTER — Ambulatory Visit: Payer: 59 | Admitting: Adult Health

## 2021-04-10 VITALS — BP 123/81 | HR 94 | Ht 62.25 in | Wt 230.0 lb

## 2021-04-10 DIAGNOSIS — F17201 Nicotine dependence, unspecified, in remission: Secondary | ICD-10-CM | POA: Insufficient documentation

## 2021-04-10 DIAGNOSIS — Z720 Tobacco use: Secondary | ICD-10-CM | POA: Insufficient documentation

## 2021-04-10 DIAGNOSIS — F17200 Nicotine dependence, unspecified, uncomplicated: Secondary | ICD-10-CM | POA: Insufficient documentation

## 2021-04-10 DIAGNOSIS — I10 Essential (primary) hypertension: Secondary | ICD-10-CM | POA: Diagnosis not present

## 2021-04-10 DIAGNOSIS — F32A Depression, unspecified: Secondary | ICD-10-CM

## 2021-04-10 DIAGNOSIS — E7800 Pure hypercholesterolemia, unspecified: Secondary | ICD-10-CM | POA: Diagnosis not present

## 2021-04-10 DIAGNOSIS — N926 Irregular menstruation, unspecified: Secondary | ICD-10-CM

## 2021-04-10 DIAGNOSIS — E119 Type 2 diabetes mellitus without complications: Secondary | ICD-10-CM

## 2021-04-10 DIAGNOSIS — F419 Anxiety disorder, unspecified: Secondary | ICD-10-CM | POA: Diagnosis not present

## 2021-04-10 HISTORY — DX: Type 2 diabetes mellitus without complications: E11.9

## 2021-04-10 MED ORDER — SERTRALINE HCL 100 MG PO TABS: 100.0000 mg | ORAL_TABLET | Freq: Every day | ORAL | 6 refills | Status: DC

## 2021-04-10 MED ORDER — SIMVASTATIN 10 MG PO TABS: 10.0000 mg | ORAL_TABLET | Freq: Every day | ORAL | 6 refills | Status: DC

## 2021-04-10 MED ORDER — BUSPIRONE HCL 5 MG PO TABS: 5.0000 mg | ORAL_TABLET | Freq: Three times a day (TID) | ORAL | 3 refills | Status: DC

## 2021-04-10 MED ORDER — METFORMIN HCL 500 MG PO TABS: 500.0000 mg | ORAL_TABLET | Freq: Two times a day (BID) | ORAL | 6 refills | Status: DC

## 2021-04-10 NOTE — Progress Notes (Signed)
Subjective:     Patient ID: Rachel Higgins, female   DOB: March 01, 1991, 31 y.o.   MRN: [id number]  HPI Rachel Higgins is a 31 year old white female,married, G0P0 in for BP check and follow up on anxiety and depression. She has seen Norm Salt, RD, and is eating better and has lost a few pounds and blood sugars better.  She is feeling anxious, esp with DM diagnosis.  Lab Results  Component Value Date   DIAGPAP  08/30/2018    NEGATIVE FOR INTRAEPITHELIAL LESIONS OR MALIGNANCY.   HPV NOT DETECTED 08/30/2018    Review of Systems +anxious Reviewed past medical,surgical, social and family history. Reviewed medications and allergies.     Objective:   Physical Exam BP 123/81 (BP Location: Right Arm, Patient Position: Sitting, Cuff Size: Normal)    Pulse 94    Ht 5' 2.25" (1.581 m)    Wt 230 lb (104.3 kg)    LMP 03/15/2021    BMI 41.73 kg/m     Skin warm and dry.  Lungs: clear to ausculation bilaterally. Cardiovascular: regular rate and rhythm.   Upstream - 04/10/21 0911       Pregnancy Intention Screening   Does the patient want to become pregnant in the next year? Ok Either Way    Does the patient's partner want to become pregnant in the next year? Ok Either Way    Would the patient like to discuss contraceptive options today? No      Contraception Wrap Up   Current Method No Method - Other Reason    End Method No Method - Other Reason    Contraception Counseling Provided No             Depression screen [facility] 2/9 04/10/2021 02/17/2021 10/08/2020  Decreased Interest 1 0 1  Down, Depressed, Hopeless 2 0 1  PHQ - 2 Score 3 0 2  Altered sleeping 3 - 3  Tired, decreased energy 2 - 3  Change in appetite 1 - 1  Feeling bad or failure about yourself  1 - 1  Trouble concentrating 3 - 2  Moving slowly or fidgety/restless 0 - 2  Suicidal thoughts 0 - 0  PHQ-9 Score 13 - 14  Difficult doing work/chores - - -    GAD 7 : Generalized Anxiety Score 04/10/2021 10/08/2020 01/15/2020 09/06/2019   Nervous, Anxious, on Edge 2 2 1 2   Control/stop worrying 1 1 1 2   Worry too much - different things 1 1 1 2   Trouble relaxing 2 2 2 1   Restless 2 1 3  0  Easily annoyed or irritable 0 1 0 2  Afraid - awful might happen 1 1 0 1  Total GAD 7 Score 9 9 8 10   Anxiety Difficulty - - Not difficult at all -     Assessment:     1. Elevated cholesterol Continue Zocor 10 mg  - Comprehensive metabolic panel - Lipid panel  2. Type 2 diabetes mellitus without complication, without long-term current use of insulin (HCC) Continue metformin 500 mg bid - Hemoglobin A1c  3. Hypertension, unspecified type Will continue cozaar - Comprehensive metabolic panel  4. Anxiety and depression Will increase Zoloft to 100 mg daily and add buspar  Meds ordered this encounter  Medications   metFORMIN (GLUCOPHAGE) 500 MG tablet    Sig: Take 1 tablet (500 mg total) by mouth 2 (two) times daily with a meal.    Dispense:  60 tablet    Refill:  6  Order Specific Question:   Supervising Provider    Answer:   Lazaro Arms [[id number]]   simvastatin (ZOCOR) 10 MG tablet    Sig: Take 1 tablet (10 mg total) by mouth daily.    Dispense:  30 tablet    Refill:  6    Order Specific Question:   Supervising Provider    Answer:   Duane Lope H [[id number]]   sertraline (ZOLOFT) 100 MG tablet    Sig: Take 1 tablet (100 mg total) by mouth daily.    Dispense:  30 tablet    Refill:  6    Order Specific Question:   Supervising Provider    Answer:   Despina Hidden, LUTHER H [[id number]]   busPIRone (BUSPAR) 5 MG tablet    Sig: Take 1 tablet (5 mg total) by mouth 3 (three) times daily.    Dispense:  90 tablet    Refill:  3    Order Specific Question:   Supervising Provider    Answer:   Despina Hidden, LUTHER H [[id number]]    5. Smoker Smoking 1/2 pack, try to continue to decrease  6. Irregular periods Follow for now    Plan:     Follow up in 8 weeks for ROS on increasing Zoloft and adding Buspar Follow up with Rachel Higgins as scheduled and continue  weight loss efforts

## 2021-04-11 LAB — LIPID PANEL
Chol/HDL Ratio: 4.6 ratio — ABNORMAL HIGH (ref 0.0–4.4)
Cholesterol, Total: 181 mg/dL (ref 100–199)
HDL: 39 mg/dL — ABNORMAL LOW (ref >39)
LDL Chol Calc (NIH): 114 mg/dL — ABNORMAL HIGH (ref 0–99)
Triglycerides: 157 mg/dL — ABNORMAL HIGH (ref 0–149)
VLDL Cholesterol Cal: 28 mg/dL (ref 5–40)

## 2021-04-11 LAB — COMPREHENSIVE METABOLIC PANEL
ALT: 28 IU/L (ref 0–32)
AST: 24 IU/L (ref 0–40)
Albumin/Globulin Ratio: 1.8 (ref 1.2–2.2)
Albumin: 4.6 g/dL (ref 3.8–4.8)
Alkaline Phosphatase: 71 IU/L (ref 44–121)
BUN/Creatinine Ratio: 11 (ref 9–23)
BUN: 8 mg/dL (ref 6–20)
Bilirubin Total: <0.2 mg/dL (ref 0.0–1.2)
CO2: 23 mmol/L (ref 20–29)
Calcium: 9.7 mg/dL (ref 8.7–10.2)
Chloride: 103 mmol/L (ref 96–106)
Creatinine, Ser: 0.76 mg/dL (ref 0.57–1.00)
Globulin, Total: 2.5 g/dL (ref 1.5–4.5)
Glucose: 110 mg/dL — ABNORMAL HIGH (ref 70–99)
Potassium: 4.3 mmol/L (ref 3.5–5.2)
Sodium: 139 mmol/L (ref 134–144)
Total Protein: 7.1 g/dL (ref 6.0–8.5)
eGFR: 106 mL/min/{1.73_m2} (ref >59)

## 2021-04-11 LAB — HEMOGLOBIN A1C
Est. average glucose Bld gHb Est-mCnc: 140 mg/dL
Hgb A1c MFr Bld: 6.5 % — ABNORMAL HIGH (ref 4.8–5.6)

## 2021-06-04 ENCOUNTER — Encounter: Payer: Self-pay | Admitting: Adult Health

## 2021-06-04 ENCOUNTER — Ambulatory Visit: Payer: 59 | Admitting: Adult Health

## 2021-06-04 VITALS — BP 123/78 | HR 87 | Ht 62.25 in | Wt 222.0 lb

## 2021-06-04 DIAGNOSIS — F32A Depression, unspecified: Secondary | ICD-10-CM

## 2021-06-04 DIAGNOSIS — I10 Essential (primary) hypertension: Secondary | ICD-10-CM | POA: Diagnosis not present

## 2021-06-04 DIAGNOSIS — F419 Anxiety disorder, unspecified: Secondary | ICD-10-CM | POA: Diagnosis not present

## 2021-06-04 NOTE — Progress Notes (Signed)
?  Subjective:  ?  ? Patient ID: Rachel Higgins, female   DOB: 21-Sep-1990, 31 y.o.   MRN: [id number] ? ?HPI ?Azyriah is a 31 year old white female, married, G0P0, back in follow up on taking Zoloft and Buspar and is feeling better. ?She is walking more and has lost 8 lbs. ? ? ?Review of Systems ?She says she is feeling better.not as anxious or depressed  ?Reviewed past medical,surgical, social and family history. Reviewed medications and allergies.  ?   ?Objective:  ? Physical Exam ?BP 123/78 (BP Location: Left Arm, Patient Position: Sitting, Cuff Size: Large)   Pulse 87   Ht 5' 2.25" (1.581 m)   Wt 222 lb (100.7 kg)   LMP 05/13/2021   BMI 40.28 kg/m?   ?  Skin warm and dry.  Lungs: clear to ausculation bilaterally. Cardiovascular: regular rate and rhythm.  ?Has lost 8 lbs since last visit ? ?Fall risk is low. ? ?  06/04/2021  ?  8:35 AM 04/10/2021  ?  9:02 AM 02/17/2021  ?  8:14 AM  ?Depression screen PHQ 2/9  ?Decreased Interest 1 1 0  ?Down, Depressed, Hopeless 1 2 0  ?PHQ - 2 Score 2 3 0  ?Altered sleeping 2 3   ?Tired, decreased energy 2 2   ?Change in appetite 1 1   ?Feeling bad or failure about yourself  1 1   ?Trouble concentrating 3 3   ?Moving slowly or fidgety/restless 2 0   ?Suicidal thoughts 0 0   ?PHQ-9 Score 13 13   ?Difficult doing work/chores Somewhat difficult    ?  ? ?  06/04/2021  ?  8:38 AM 04/10/2021  ?  9:06 AM 10/08/2020  ?  2:26 PM 01/15/2020  ?  4:13 PM  ?GAD 7 : Generalized Anxiety Score  ?Nervous, Anxious, on Edge 2 2 2 1   ?Control/stop worrying 1 1 1 1   ?Worry too much - different things 1 1 1 1   ?Trouble relaxing 1 2 2 2   ?Restless 2 2 1 3   ?Easily annoyed or irritable 1 0 1 0  ?Afraid - awful might happen 1 1 1  0  ?Total GAD 7 Score 9 9 9 8   ?Anxiety Difficulty Not difficult at all   Not difficult at all  ? ?. ? Upstream - 06/04/21 0833   ? ?  ? Pregnancy Intention Screening  ? Does the patient want to become pregnant in the next year? Ok Either Way   ? Does the patient's partner want to become  pregnant in the next year? Ok Either Way   ? Would the patient like to discuss contraceptive options today? No   ?  ? Contraception Wrap Up  ? Current Method No Method - Other Reason   ? End Method No Method - Other Reason   ? ?  ?  ? ?  ?  ?Assessment:  ?   ?1. Anxiety and depression ?She is feeling better ?Will continue Buspar 5 mg tid and Zoloft 100 mg 1 daily, has refills ? ?2. Hypertension, unspecified type ?BP is better  ?Continue cozaar 50 mg daily  ?And weight loss, has lost 8 lb since last visit ?Continue walking  ?   ?Plan:  ?   ?Follow up in 5 weeks for ROS ?Will check A1c then  ?   ?

## 2021-06-05 ENCOUNTER — Ambulatory Visit: Payer: 59 | Admitting: Adult Health

## 2021-06-09 ENCOUNTER — Other Ambulatory Visit: Payer: Self-pay | Admitting: Adult Health

## 2021-07-09 ENCOUNTER — Ambulatory Visit: Payer: 59 | Admitting: Adult Health

## 2021-07-15 ENCOUNTER — Encounter: Payer: Self-pay | Admitting: Adult Health

## 2021-07-15 ENCOUNTER — Other Ambulatory Visit (HOSPITAL_COMMUNITY)
Admission: RE | Admit: 2021-07-15 | Discharge: 2021-07-15 | Disposition: A | Discharge status: Home / Self Care | Payer: 59 | Source: Ambulatory Visit | Attending: Adult Health | Admitting: Adult Health

## 2021-07-15 ENCOUNTER — Ambulatory Visit: Payer: 59 | Admitting: Adult Health

## 2021-07-15 VITALS — BP 129/87 | HR 77 | Ht 62.0 in | Wt 214.0 lb

## 2021-07-15 DIAGNOSIS — Z113 Encounter for screening for infections with a predominantly sexual mode of transmission: Secondary | ICD-10-CM | POA: Insufficient documentation

## 2021-07-15 DIAGNOSIS — E119 Type 2 diabetes mellitus without complications: Secondary | ICD-10-CM

## 2021-07-15 DIAGNOSIS — F32A Depression, unspecified: Secondary | ICD-10-CM

## 2021-07-15 DIAGNOSIS — F419 Anxiety disorder, unspecified: Secondary | ICD-10-CM

## 2021-07-15 MED ORDER — BUSPIRONE HCL 5 MG PO TABS: 5.0000 mg | ORAL_TABLET | Freq: Three times a day (TID) | ORAL | 3 refills | Status: DC

## 2021-07-15 NOTE — Progress Notes (Signed)
?Subjective:  ?  ? Patient ID: Rachel Higgins, female   DOB: November 20, 1990, 31 y.o.   MRN: [id number] ? ?HPI ?Rachel Higgins is a 31 year old white female,single, G0P0, back in follow up on taking Zoloft and Buspar ans was better then DP cheated and asked her to leave, she is with her mom now, and she is due A1c. ?Lab Results  ?Component Value Date  ? DIAGPAP  08/30/2018  ?  NEGATIVE FOR INTRAEPITHELIAL LESIONS OR MALIGNANCY.  ? HPV NOT DETECTED 08/30/2018  ?  ?Review of Systems ?+anxiety and depression ?Sad over break up ?Reviewed past medical,surgical, social and family history. Reviewed medications and allergies.  ?   ?Objective:  ? Physical Exam ?BP 129/87 (BP Location: Left Arm, Patient Position: Sitting, Cuff Size: Normal)   Pulse 77   Ht 5\' 2"  (1.575 m)   Wt 214 lb (97.1 kg)   LMP 07/04/2021 (Exact Date)   BMI 39.14 kg/m?   ?  Skin warm and dry. Lungs: clear to ausculation bilaterally. Cardiovascular: regular rate and rhythm.  ? Upstream - 07/15/21 0955   ? ?  ? Pregnancy Intention Screening  ? Does the patient want to become pregnant in the next year? No   ? Does the patient's partner want to become pregnant in the next year? No   ? Would the patient like to discuss contraceptive options today? No   ?  ? Contraception Wrap Up  ? Current Method No Method - Other Reason   ? End Method No Method - Other Reason   ? Contraception Counseling Provided No   ? ?  ?  ? ?  ?  ? ?  07/15/2021  ?  9:40 AM 06/04/2021  ?  8:38 AM 04/10/2021  ?  9:06 AM 10/08/2020  ?  2:26 PM  ?GAD 7 : Generalized Anxiety Score  ?Nervous, Anxious, on Edge 3 2 2 2   ?Control/stop worrying 2 1 1 1   ?Worry too much - different things 2 1 1 1   ?Trouble relaxing 2 1 2 2   ?Restless 1 2 2 1   ?Easily annoyed or irritable 1 1 0 1  ?Afraid - awful might happen 2 1 1 1   ?Total GAD 7 Score 13 9 9 9   ?Anxiety Difficulty  Not difficult at all    ? ?  ? ?  07/15/2021  ?  9:40 AM 06/04/2021  ?  8:35 AM 04/10/2021  ?  9:02 AM  ?Depression screen PHQ 2/9  ?Decreased Interest 2  1 1   ?Down, Depressed, Hopeless 2 1 2   ?PHQ - 2 Score 4 2 3   ?Altered sleeping 3 2 3   ?Tired, decreased energy 1 2 2   ?Change in appetite 1 1 1   ?Feeling bad or failure about yourself  3 1 1   ?Trouble concentrating 2 3 3   ?Moving slowly or fidgety/restless 1 2 0  ?Suicidal thoughts 0 0 0  ?PHQ-9 Score 15 13 13   ?Difficult doing work/chores  Somewhat difficult   ?  ?Assessment:  ?   ?1. Anxiety and depression ?Will continue Buspar 5 mg tid and Zoloft 100 mg 1 daily ?Meds ordered this encounter  ?Medications  ? busPIRone (BUSPAR) 5 MG tablet  ?  Sig: Take 1 tablet (5 mg total) by mouth 3 (three) times daily.  ?  Dispense:  90 tablet  ?  Refill:  3  ?  Order Specific Question:   Supervising Provider  ?  Answer:   Tania Ade H [[id number]]  ?  Encouraged counseling now, try OBC ? ? ?2. Type 2 diabetes mellitus without complication, without long-term current use of insulin ([place]) ?Check A1c today ?- Hemoglobin A1c ? ?3. Screening examination for STD (sexually transmitted disease) ?CV swab done by pt will check GC/CHL and trich ?- Hepatitis C antibody ?- Hepatitis B surface antigen ?- HIV Antibody (routine testing w rflx) ?- RPR ?- Cervicovaginal ancillary only( [place])  ?   ?Plan:  ?   ?Follow up in 3 months for pap and physical with me  ?A1c then too. ?   ?

## 2021-07-16 LAB — RPR: RPR Ser Ql: NONREACTIVE

## 2021-07-16 LAB — HIV ANTIBODY (ROUTINE TESTING W REFLEX): HIV Screen 4th Generation wRfx: NONREACTIVE

## 2021-07-16 LAB — CERVICOVAGINAL ANCILLARY ONLY
Chlamydia: NEGATIVE
Comment: NEGATIVE
Comment: NEGATIVE
Comment: NORMAL
Neisseria Gonorrhea: NEGATIVE
Trichomonas: NEGATIVE

## 2021-07-16 LAB — HEPATITIS C ANTIBODY: Hep C Virus Ab: NONREACTIVE

## 2021-07-16 LAB — HEMOGLOBIN A1C
Est. average glucose Bld gHb Est-mCnc: 126 mg/dL
Hgb A1c MFr Bld: 6 % — ABNORMAL HIGH (ref 4.8–5.6)

## 2021-07-16 LAB — HEPATITIS B SURFACE ANTIGEN: Hepatitis B Surface Ag: NEGATIVE

## 2021-08-17 ENCOUNTER — Ambulatory Visit: Payer: 59 | Admitting: Nutrition

## 2021-08-26 ENCOUNTER — Ambulatory Visit: Payer: 59 | Admitting: Nutrition

## 2021-09-07 ENCOUNTER — Encounter: Payer: 59 | Admitting: Nutrition

## 2021-09-07 ENCOUNTER — Other Ambulatory Visit: Payer: Self-pay | Admitting: *Deleted

## 2021-09-07 MED ORDER — OMEPRAZOLE 40 MG PO CPDR: 40.0000 mg | DELAYED_RELEASE_CAPSULE | Freq: Every day | ORAL | 3 refills | Status: DC

## 2021-09-08 ENCOUNTER — Ambulatory Visit: Payer: 59 | Admitting: Nutrition

## 2021-09-08 ENCOUNTER — Encounter: Payer: 59 | Attending: Adult Health | Admitting: Nutrition

## 2021-09-08 VITALS — Ht 62.0 in | Wt 213.0 lb

## 2021-09-08 DIAGNOSIS — E782 Mixed hyperlipidemia: Secondary | ICD-10-CM | POA: Insufficient documentation

## 2021-09-08 DIAGNOSIS — E559 Vitamin D deficiency, unspecified: Secondary | ICD-10-CM | POA: Insufficient documentation

## 2021-09-08 DIAGNOSIS — I10 Essential (primary) hypertension: Secondary | ICD-10-CM | POA: Insufficient documentation

## 2021-09-08 DIAGNOSIS — E118 Type 2 diabetes mellitus with unspecified complications: Secondary | ICD-10-CM | POA: Insufficient documentation

## 2021-09-08 NOTE — Progress Notes (Addendum)
Medical Nutrition Therapy  DM Follow up Appointment Start time:  0800  Appointment End time:  0830 Learning readiness: Ready    NUTRITION ASSESSMENT DM Follow up Started going to gym daily. Has been stressed recently. Has still bloating issues.  [id number] mg/dl. Exercising some and makes her feel much better. Suppose to get her A1C done next week with her PCP. Last A1C was 6.5% 2/23  Has lost 1 lbs.  Feels less bloated. Avoiding dairy and most gluten foods. Metformin 500 mg BID. Has been cutting out sweets. Eating more of a balanced meals. Not eating after after 8 pm due to her work schedule. FBS 101-120's  bedtime: 86-120's Drinking 80 oz of water. Cutting back on sweet tea and diluting it to be more like unsweet tea. Has cut out sugar in coffee. Still smoking 10 cigarettes. Still having issuees with anxiety often. Would like to talk to provider about possible anxiety medications. Would like referral to counselor. Enjoying her 30 minutes of her exercise daily after work.  30 day avg BS was 112 mg/dl. To see OBGYN in a week and will get A1C done then most likely.  Goals previously set:  Eat three balanced meals per day.-done well. Don't skip meals-done Increase high fiber foods to help lower your cholesterol-done Cut out processed meats and fast foods-done Increase fruit and vegetables.-done Walk 30 minutes a day-using a fitbit Test blood sugars before breakfast and bedtime. Focus on eating more foods out of a garden-plant based foods. Done  Lose 1 lb per week.-lost 2 lbs in the last month.   Anthropometrics    Wt Readings from Last 3 Encounters:  07/15/21 214 lb (97.1 kg)  06/04/21 222 lb (100.7 kg)  04/10/21 230 lb (104.3 kg)   Ht Readings from Last 3 Encounters:  07/15/21 5\' 2"  (1.575 m)  06/04/21 5' 2.25" (1.581 m)  04/10/21 5' 2.25" (1.581 m)   There is no height or weight on file to calculate BMI. @BMIFA @ Facility age limit for growth %iles is 20  years. Facility age limit for growth %iles is 20 years.   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 Eats at home. Eats fast food 2-3 times per week. Likes processed meats-especially sausage.  Estimated daily fluid intake: 80 oz Supplements: VIt D, B12 Sleep: 8 hrs  Stress / self-care: none Current average weekly physical activity: ADL  24-Hr Dietary Recall First Meal: 2 eggs,Toast 1 slice Second Meal: PB sandwich thins, water Snack: r Third Meal: roasted chicken, vegetable, wild rice.water, Snack:  Beverages: water,   Estimated Energy Needs Calories: 1200 Carbohydrate: 135 g Protein: 90 g Fat: 33 g   NUTRITION DIAGNOSIS  NB-1.1 Food and nutrition-related knowledge deficit As related to Diabetes Type 2.  As evidenced by A1C 6.6%.   NUTRITION INTERVENTION  Nutrition education (E-1) on the following topics:   Lifestyle Medicine - Whole Food,  Plant Predominant Nutrition is highly recommended: Eat Plenty of vegetables, Mushrooms, fruits, Legumes, Whole Grains, Nuts, seeds in lieu of processed meats, processed snacks/pastries red meat, poultry, eggs.    -It is better to avoid simple carbohydrates including: Cakes, Sweet Desserts, Ice Cream, Soda (diet and regular), Sweet Tea, Candies, Chips, Cookies, Store Bought Juices, Alcohol in Excess of  1-2 drinks a day, Lemonade,  Artificial Sweeteners, Doughnuts, Coffee Creamers, "Sugar-free"  Products, etc, etc.  This is not a complete list.....  Exercise: If you are able: 30 -60 minutes a day ,4 days a week, or 150 minutes a week.  The longer the better.  Combine stretch, strength, and aerobic activities.  If you were told in the past that you have high risk for cardiovascular diseases, you may seek evaluation by your heart doctor prior to initiating moderate to intense exercise programs.  Handouts Provided Include  [facility] nutrition Diabetes and You booklet Meal Plan Card  Learning Style & Readiness for Change Teaching method utilized: Visual & Auditory  Demonstrated degree of understanding via: Teach Back  Barriers to learning/adherence to lifestyle change: none  Goals Established by Pt Goals  Cut out a processed foods. Eat a pice of fruit with each meal. Increase low carb vegetables. Contact local therapist and OBGYN to address despression and medications.  MONITORING & EVALUATION Dietary intake, weekly physical activity, and blood sugars and weight in 4-5 month.  Talk to PCP about depression issues and consider referral to therapist for counseling.   Next Steps  Patient is to work on meal planning and eating more plant based foods.Marland Kitchen

## 2021-09-08 NOTE — Patient Instructions (Signed)
Goals  Cut out a processed foods. Eat a pice of fruit with each meal. Increase low carb vegetables. Contact local therapist and OBGYN to address despression and medications.

## 2021-09-09 ENCOUNTER — Telehealth: Payer: Self-pay | Admitting: *Deleted

## 2021-09-09 NOTE — Telephone Encounter (Signed)
Pt's insurance only covers a certain # per 365 days of Omeprazole. Cash price is $15.00. Pt aware and will pay cash price for med. JSY

## 2021-10-15 ENCOUNTER — Other Ambulatory Visit (HOSPITAL_COMMUNITY)
Admission: RE | Admit: 2021-10-15 | Discharge: 2021-10-15 | Disposition: A | Discharge status: Home / Self Care | Payer: 59 | Source: Ambulatory Visit | Attending: Adult Health | Admitting: Adult Health

## 2021-10-15 ENCOUNTER — Ambulatory Visit (INDEPENDENT_AMBULATORY_CARE_PROVIDER_SITE_OTHER): Payer: 59 | Admitting: Adult Health

## 2021-10-15 ENCOUNTER — Encounter: Payer: Self-pay | Admitting: Adult Health

## 2021-10-15 VITALS — BP 130/83 | HR 80 | Ht 62.25 in | Wt 213.0 lb

## 2021-10-15 DIAGNOSIS — E119 Type 2 diabetes mellitus without complications: Secondary | ICD-10-CM | POA: Diagnosis not present

## 2021-10-15 DIAGNOSIS — Z01419 Encounter for gynecological examination (general) (routine) without abnormal findings: Secondary | ICD-10-CM | POA: Diagnosis not present

## 2021-10-15 DIAGNOSIS — E7800 Pure hypercholesterolemia, unspecified: Secondary | ICD-10-CM

## 2021-10-15 DIAGNOSIS — F32A Depression, unspecified: Secondary | ICD-10-CM

## 2021-10-15 DIAGNOSIS — I10 Essential (primary) hypertension: Secondary | ICD-10-CM

## 2021-10-15 DIAGNOSIS — F419 Anxiety disorder, unspecified: Secondary | ICD-10-CM | POA: Diagnosis not present

## 2021-10-15 DIAGNOSIS — F17200 Nicotine dependence, unspecified, uncomplicated: Secondary | ICD-10-CM

## 2021-10-15 MED ORDER — LOSARTAN POTASSIUM 50 MG PO TABS: ORAL_TABLET | ORAL | 12 refills | Status: DC

## 2021-10-15 MED ORDER — SIMVASTATIN 10 MG PO TABS: 10.0000 mg | ORAL_TABLET | Freq: Every day | ORAL | 1 refills | Status: DC

## 2021-10-15 MED ORDER — BUSPIRONE HCL 7.5 MG PO TABS: 7.5000 mg | ORAL_TABLET | Freq: Three times a day (TID) | ORAL | 6 refills | Status: DC

## 2021-10-15 MED ORDER — METFORMIN HCL 500 MG PO TABS: 500.0000 mg | ORAL_TABLET | Freq: Two times a day (BID) | ORAL | 6 refills | Status: DC

## 2021-10-15 MED ORDER — SERTRALINE HCL 100 MG PO TABS
ORAL_TABLET | ORAL | 6 refills | Status: DC
Start: 2021-10-15 — End: 2021-11-03

## 2021-10-15 NOTE — Progress Notes (Signed)
Patient ID: Rachel Higgins, female   DOB: 07/17/1990, 31 y.o.   MRN: [id number] History of Present Illness: Rachel Higgins is a 31 year old white female,single, G0P0 in for a well woman gyn exam  and pap. She is still having anxiety and depression, on Zoloft and Buspar.    Current Medications, Allergies, Past Medical History, Past Surgical History, Family History and Social History were reviewed in [employer] record.     Review of Systems:  Patient denies any headaches, hearing loss, fatigue, blurred vision, shortness of breath, chest pain, abdominal pain, problems with bowel movements, urination, or intercourse.(She is not active).  No joint pain or mood swings.  +anxiety and depression   Physical Exam:BP 130/83 (BP Location: Left Arm, Patient Position: Sitting, Cuff Size: Normal)   Pulse 80   Ht 5' 2.25" (1.581 m)   Wt 213 lb (96.6 kg)   LMP 10/15/2021   BMI 38.65 kg/m   General:  Well developed, well nourished, no acute distress Skin:  Warm and dry Neck:  Midline trachea, normal thyroid, good ROM, no lymphadenopathy Lungs; Clear to auscultation bilaterally Breast:  No dominant palpable mass, retraction, or nipple discharge Cardiovascular: Regular rate and rhythm Abdomen:  Soft, non tender, no hepatosplenomegaly Pelvic:  External genitalia is normal in appearance, no lesions.  The vagina is normal in appearance. +period blood. Urethra has no lesions or masses. The cervix is smooth, pap with HR HPV genotyping  performed. Uterus is felt to be normal size, shape, and contour.  No adnexal masses or tenderness noted.Bladder is non tender, no masses felt. Extremities/musculoskeletal:  No swelling or varicosities noted, no clubbing or cyanosis Psych:  No mood changes, alert and cooperative,seems happy AA is 0 Fall risk is low    10/15/2021    8:37 AM 07/15/2021    9:40 AM 06/04/2021    8:35 AM  Depression screen PHQ 2/9  Decreased Interest 1 2 1   Down, Depressed, Hopeless  1 2 1   PHQ - 2 Score 2 4 2   Altered sleeping 2 3 2   Tired, decreased energy 2 1 2   Change in appetite 2 1 1   Feeling bad or failure about yourself  1 3 1   Trouble concentrating 3 2 3   Moving slowly or fidgety/restless 2 1 2   Suicidal thoughts 0 0 0  PHQ-9 Score 14 15 13   Difficult doing work/chores   Somewhat difficult       10/15/2021    8:37 AM 07/15/2021    9:40 AM 06/04/2021    8:38 AM 04/10/2021    9:06 AM  GAD 7 : Generalized Anxiety Score  Nervous, Anxious, on Edge 2 3 2 2   Control/stop worrying 2 2 1 1   Worry too much - different things 2 2 1 1   Trouble relaxing 2 2 1 2   Restless 2 1 2 2   Easily annoyed or irritable 2 1 1  0  Afraid - awful might happen 1 2 1 1   Total GAD 7 Score 13 13 9 9   Anxiety Difficulty   Not difficult at all     Upstream - 10/15/21 0841       Pregnancy Intention Screening   Does the patient want to become pregnant in the next year? No    Does the patient's partner want to become pregnant in the next year? No    Would the patient like to discuss contraceptive options today? No      Contraception Wrap Up   Current Method Abstinence  End Method Abstinence            Examination chaperoned by Malachy Mood LPN    Impression and Plan: 1. Encounter for gynecological examination with Papanicolaou smear of cervix Pap sent Pap in 3 years if normal Physical in 1 year - Cytology - PAP( [place])  2. Anxiety and depression Will increase zoloft to 150 mg daily and buspar to 7.5 mg daily  Will [facility] to [facility] - Ambulatory referral to Behavioral Health Follow up with me in 8 weeks for ROS  3. Type 2 diabetes mellitus without complication, without long-term current use of insulin ([facility]) Will refill metformin 500 mg 1 bid  Will refer to endocrinology,Dr Nida, she wants to lose weight too Will check labs fasting, orders given to her  - Comprehensive metabolic panel - Hemoglobin A1c - Ambulatory referral to Endocrinology She had eye exam with  Dr Charise Killian 07/15/21   Meds ordered this encounter  Medications   sertraline (ZOLOFT) 100 MG tablet    Sig: Take 1 1/2 tablet daily    Dispense:  45 tablet    Refill:  6    Order Specific Question:   Supervising Provider    Answer:   Duane Lope H [[id number]]   metFORMIN (GLUCOPHAGE) 500 MG tablet    Sig: Take 1 tablet (500 mg total) by mouth 2 (two) times daily with a meal.    Dispense:  60 tablet    Refill:  6    Order Specific Question:   Supervising Provider    Answer:   Despina Hidden, LUTHER H [[id number]]   losartan (COZAAR) 50 MG tablet    Sig: TAKE 1 TABLET BY MOUTH ONCE DAILY - TIME FOR LABS    Dispense:  30 tablet    Refill:  12    Order Specific Question:   Supervising Provider    Answer:   Duane Lope H [[id number]]   busPIRone (BUSPAR) 7.5 MG tablet    Sig: Take 1 tablet (7.5 mg total) by mouth 3 (three) times daily.    Dispense:  90 tablet    Refill:  6    Order Specific Question:   Supervising Provider    Answer:   Duane Lope H [[id number]]   simvastatin (ZOCOR) 10 MG tablet    Sig: Take 1 tablet (10 mg total) by mouth daily.    Dispense:  30 tablet    Refill:  1    Order Specific Question:   Supervising Provider    Answer:   Despina Hidden, LUTHER H [[id number]]    4. Hypertension, unspecified type Will refill cozaar 50 mg daily  - Comprehensive metabolic panel  5. Elevated cholesterol Will refill Zocor  10 mg daily for now and await llab results  - Comprehensive metabolic panel - Lipid panel  6. Smoker Needs to try to cut down and quit

## 2021-10-21 LAB — CYTOLOGY - PAP
Comment: NEGATIVE
Diagnosis: NEGATIVE
High risk HPV: NEGATIVE

## 2021-11-03 ENCOUNTER — Encounter (HOSPITAL_COMMUNITY): Payer: Self-pay | Admitting: Psychiatry

## 2021-11-03 ENCOUNTER — Telehealth (INDEPENDENT_AMBULATORY_CARE_PROVIDER_SITE_OTHER): Payer: 59 | Admitting: Psychiatry

## 2021-11-03 DIAGNOSIS — F17200 Nicotine dependence, unspecified, uncomplicated: Secondary | ICD-10-CM

## 2021-11-03 DIAGNOSIS — F332 Major depressive disorder, recurrent severe without psychotic features: Secondary | ICD-10-CM | POA: Diagnosis not present

## 2021-11-03 DIAGNOSIS — F411 Generalized anxiety disorder: Secondary | ICD-10-CM

## 2021-11-03 DIAGNOSIS — F3342 Major depressive disorder, recurrent, in full remission: Secondary | ICD-10-CM | POA: Insufficient documentation

## 2021-11-03 DIAGNOSIS — F4310 Post-traumatic stress disorder, unspecified: Secondary | ICD-10-CM | POA: Diagnosis not present

## 2021-11-03 DIAGNOSIS — F331 Major depressive disorder, recurrent, moderate: Secondary | ICD-10-CM | POA: Insufficient documentation

## 2021-11-03 DIAGNOSIS — F410 Panic disorder [episodic paroxysmal anxiety] without agoraphobia: Secondary | ICD-10-CM

## 2021-11-03 DIAGNOSIS — F330 Major depressive disorder, recurrent, mild: Secondary | ICD-10-CM | POA: Insufficient documentation

## 2021-11-03 HISTORY — DX: Post-traumatic stress disorder, unspecified: F43.10

## 2021-11-03 MED ORDER — CITALOPRAM HYDROBROMIDE 10 MG PO TABS: 10.0000 mg | ORAL_TABLET | Freq: Every evening | ORAL | 0 refills | Status: DC
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
## 2021-11-03 NOTE — Patient Instructions (Signed)
We made some adjustments to your medication today. Starting 11/04/21, decrease your sertraline to 100mg  (1 tablet) once daily in the morning. Stop taking this on 11/11/21. On 11/11/21 bedtime, start taking citalopram 10mg  (1 tablet) at night.

## 2021-11-09 ENCOUNTER — Encounter (HOSPITAL_COMMUNITY): Payer: Self-pay

## 2021-11-09 ENCOUNTER — Ambulatory Visit (INDEPENDENT_AMBULATORY_CARE_PROVIDER_SITE_OTHER): Payer: 59 | Admitting: Clinical

## 2021-11-09 DIAGNOSIS — F332 Major depressive disorder, recurrent severe without psychotic features: Secondary | ICD-10-CM

## 2021-11-09 DIAGNOSIS — F410 Panic disorder [episodic paroxysmal anxiety] without agoraphobia: Secondary | ICD-10-CM

## 2021-11-09 DIAGNOSIS — F411 Generalized anxiety disorder: Secondary | ICD-10-CM | POA: Diagnosis not present

## 2021-11-09 DIAGNOSIS — F4310 Post-traumatic stress disorder, unspecified: Secondary | ICD-10-CM | POA: Diagnosis not present

## 2021-11-09 NOTE — Plan of Care (Signed)
Verbal Consent 
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
## 2021-11-18 ENCOUNTER — Telehealth (INDEPENDENT_AMBULATORY_CARE_PROVIDER_SITE_OTHER): Payer: 59 | Admitting: Psychiatry

## 2021-11-18 ENCOUNTER — Encounter (HOSPITAL_COMMUNITY): Payer: Self-pay | Admitting: Psychiatry

## 2021-11-18 DIAGNOSIS — F332 Major depressive disorder, recurrent severe without psychotic features: Secondary | ICD-10-CM

## 2021-11-18 DIAGNOSIS — F411 Generalized anxiety disorder: Secondary | ICD-10-CM

## 2021-11-18 DIAGNOSIS — F17200 Nicotine dependence, unspecified, uncomplicated: Secondary | ICD-10-CM | POA: Diagnosis not present

## 2021-11-18 DIAGNOSIS — F4310 Post-traumatic stress disorder, unspecified: Secondary | ICD-10-CM

## 2021-11-18 DIAGNOSIS — F410 Panic disorder [episodic paroxysmal anxiety] without agoraphobia: Secondary | ICD-10-CM

## 2021-11-18 MED ORDER — CITALOPRAM HYDROBROMIDE 10 MG PO TABS: 10.0000 mg | ORAL_TABLET | Freq: Every evening | ORAL | 0 refills | Status: DC
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
## 2021-11-18 NOTE — Patient Instructions (Addendum)
We made some adjustments to your medication today. Starting 11/12/21, decrease your sertraline to 50mg  (half a tablet) once daily in the morning. Stop taking this on 11/24/21. On 11/18/21 bedtime, start taking citalopram 10mg  (1 tablet) at night. If [facility] is still saying [facility] haven't received the prescription order, please call our [place] office at [phone] so that we can address this.

## 2021-11-19 ENCOUNTER — Telehealth (HOSPITAL_COMMUNITY): Payer: Self-pay

## 2021-11-19 DIAGNOSIS — F411 Generalized anxiety disorder: Secondary | ICD-10-CM

## 2021-11-19 DIAGNOSIS — F4310 Post-traumatic stress disorder, unspecified: Secondary | ICD-10-CM

## 2021-11-19 DIAGNOSIS — F332 Major depressive disorder, recurrent severe without psychotic features: Secondary | ICD-10-CM

## 2021-11-19 DIAGNOSIS — F410 Panic disorder [episodic paroxysmal anxiety] without agoraphobia: Secondary | ICD-10-CM

## 2021-11-19 MED ORDER — SERTRALINE HCL 50 MG PO TABS: ORAL_TABLET | ORAL | 0 refills | Status: DC

## 2021-11-19 NOTE — Telephone Encounter (Addendum)
Pt states that she is out of Zoloft and was told to taper off. She wanted see if we can get this Rx refilled at [facility], [place]   Patient was able to pick up citalopram. One week (7 pills) sent of sertraline 50mg  to be taken once daily as part of cross taper.

## 2021-11-25 ENCOUNTER — Other Ambulatory Visit: Payer: Self-pay | Admitting: Adult Health

## 2021-11-25 MED ORDER — NORETHINDRONE 0.35 MG PO TABS: 1.0000 | ORAL_TABLET | Freq: Every day | ORAL | 11 refills | Status: DC

## 2021-11-25 NOTE — Progress Notes (Signed)
Will rx micronor 

## 2021-11-27 ENCOUNTER — Encounter: Payer: Self-pay | Admitting: Nurse Practitioner

## 2021-11-27 ENCOUNTER — Ambulatory Visit (INDEPENDENT_AMBULATORY_CARE_PROVIDER_SITE_OTHER): Payer: 59 | Admitting: Nurse Practitioner

## 2021-11-27 VITALS — BP 121/75 | HR 87 | Ht 62.5 in | Wt 213.6 lb

## 2021-11-27 DIAGNOSIS — E119 Type 2 diabetes mellitus without complications: Secondary | ICD-10-CM | POA: Diagnosis not present

## 2021-11-27 LAB — POCT GLYCOSYLATED HEMOGLOBIN (HGB A1C): Hemoglobin A1C: 5.8 % — AB (ref 4.0–5.6)

## 2021-11-27 MED ORDER — METFORMIN HCL ER 500 MG PO TB24
500.0000 mg | ORAL_TABLET | Freq: Every day | ORAL | 1 refills | Status: DC
Start: 2021-11-27 — End: 2022-04-23

## 2021-11-27 NOTE — Progress Notes (Signed)
Endocrinology Consult Note       11/27/2021, 9:47 AM   Subjective:    Patient ID: Rachel Higgins, female    DOB: 1990/03/16.  Rachel Higgins is being seen in consultation for management of currently uncontrolled symptomatic diabetes requested by  Patient, No Pcp Per.   Past Medical History:  Diagnosis Date   Anxiety and depression 09/06/2019   Broken arm 1996   Contraceptive management 09/26/2012   Contraceptive management 09/26/2012   Encounter for well woman exam with routine gynecological exam 09/06/2019   Hematuria 07/03/2013   Hypertension 09/06/2019   Late period 05/18/2018   LLQ pain 05/18/2018   Migraine headache    Painful urination 07/03/2013   Painful urination 07/03/2013   Patient desires pregnancy 01/15/2020   Postcoital bleeding 12/30/2014   Postcoital bleeding 12/30/2014   Right ovarian cyst 07/13/2013   RUQ pain 07/20/2013   RUQ pain 07/20/2013   Has adenomyomatosis of GB refer to Dr Arnoldo Morale appt 5/28 at 10 am   Thyroid nodule    Tired 12/26/2013   Tired 12/26/2013   Type 2 diabetes mellitus without complication, without long-term current use of insulin ([place]) 04/10/2021   Unspecified symptom associated with female genital organs 07/03/2013    Past Surgical History:  Procedure Laterality Date   CHOLECYSTECTOMY N/A 09/03/2013   Procedure: LAPAROSCOPIC CHOLECYSTECTOMY;  Surgeon: Jamesetta So, MD;  Location: [facility];  Service: General;  Laterality: N/A;   WISDOM TOOTH EXTRACTION      Social History   Socioeconomic History   Marital status: Single    Spouse name: Not on file   Number of children: Not on file   Years of education: Not on file   Highest education level: Not on file  Occupational History   Not on file  Tobacco Use   Smoking status: Every Day    Packs/day: 0.50    Years: 20.00    Total pack years: 10.00    Types: Cigarettes   Smokeless tobacco: Never   Tobacco comments:    form given  10-22-15. Also vapes at work.  Vaping Use   Vaping Use: Never used  Substance and Sexual Activity   Alcohol use: No   Drug use: Not Currently   Sexual activity: Not Currently    Birth control/protection: None  Other Topics Concern   Not on file  Social History Narrative   Not on file   Social Determinants of Health   Financial Resource Strain: Medium Risk (10/15/2021)   Overall Financial Resource Strain (CARDIA)    Difficulty of Paying Living Expenses: Somewhat hard  Food Insecurity: No Food Insecurity (10/15/2021)   Hunger Vital Sign    Worried About Running Out of Food in the Last Year: Never true    Ran Out of Food in the Last Year: Never true  Transportation Needs: No Transportation Needs (10/15/2021)   PRAPARE - Hydrologist (Medical): No    Lack of Transportation (Non-Medical): No  Physical Activity: Sufficiently Active (10/15/2021)   Exercise Vital Sign    Days of Exercise per Week: 6 days    Minutes of Exercise per Session: 40 min  Stress: Stress Concern  Present (10/15/2021)   [place]    Feeling of Stress : To some extent  Social Connections: Socially Isolated (10/15/2021)   Social Connection and Isolation Panel [NHANES]    Frequency of Communication with Friends and Family: Once a week    Frequency of Social Gatherings with Friends and Family: Never    Attends Religious Services: Never    Marine scientist or Organizations: No    Attends Music therapist: Never    Marital Status: Separated    Family History  Problem Relation Age of Onset   Vision loss Mother    Thyroid disease Maternal Aunt    Thyroid cancer Maternal Grandfather    Stroke Paternal Grandmother    Arthritis Paternal Grandmother    COPD Paternal Grandmother    Heart disease Paternal Grandmother    Breast cancer Paternal Grandmother    Lung cancer Paternal Grandmother    Diabetes  Paternal Grandfather    Hyperlipidemia Father    Arthritis Father    Prostate cancer Father    Esophageal cancer Neg Hx    Pancreatic cancer Neg Hx    Stomach cancer Neg Hx    Colon cancer Neg Hx    Liver disease Neg Hx     Outpatient Encounter Medications as of 11/27/2021  Medication Sig   ACCU-CHEK GUIDE test strip USE 1 STRIP TO CHECK GLUCOSE 4 TIMES DAILY   Accu-Chek Softclix Lancets lancets USE UP TO 4 TIMES DAILY AS DIRECTED   blood glucose meter kit and supplies KIT Dispense based on patient and insurance preference. Use up to four times daily as directed.   busPIRone (BUSPAR) 7.5 MG tablet Take 1 tablet (7.5 mg total) by mouth 3 (three) times daily.   Cholecalciferol 125 MCG (5000 UT) capsule Take 1 capsule (5,000 Units total) by mouth daily.   citalopram (CELEXA) 10 MG tablet Take 1 tablet (10 mg total) by mouth at bedtime. Begin 11/11/21.   losartan (COZAAR) 50 MG tablet TAKE 1 TABLET BY MOUTH ONCE DAILY - TIME FOR LABS   metFORMIN (GLUCOPHAGE-XR) 500 MG 24 hr tablet Take 1 tablet (500 mg total) by mouth daily with breakfast.   norethindrone (MICRONOR) 0.35 MG tablet Take 1 tablet (0.35 mg total) by mouth daily.   Omega-3 Fatty Acids (FISH OIL) 1000 MG CAPS Take by mouth.   omeprazole (PRILOSEC) 40 MG capsule Take 1 capsule (40 mg total) by mouth daily.   sertraline (ZOLOFT) 50 MG tablet Take one tablet by mouth once daily for one week then discontinue.   simvastatin (ZOCOR) 10 MG tablet Take 1 tablet (10 mg total) by mouth daily.   vitamin B-12 (CYANOCOBALAMIN) 500 MCG tablet Take 500 mcg by mouth daily.   [DISCONTINUED] metFORMIN (GLUCOPHAGE) 500 MG tablet Take 1 tablet (500 mg total) by mouth 2 (two) times daily with a meal.   No facility-administered encounter medications on file as of 11/27/2021.    ALLERGIES: Allergies  Allergen Reactions   Adhesive [Tape]     Rash and burns skin     VACCINATION STATUS:  There is no immunization history on file for this  patient.  Diabetes She presents for her initial diabetic visit. She has type 2 diabetes mellitus. Her disease course has been stable. There are no hypoglycemic associated symptoms. There are no diabetic associated symptoms. There are no hypoglycemic complications. Symptoms are stable. There are no diabetic complications. Risk factors for coronary artery disease include diabetes  mellitus, dyslipidemia, family history, obesity, hypertension and tobacco exposure. Current diabetic treatment includes oral agent (monotherapy). She is compliant with treatment most of the time (sometimes forgets her afternoon dose of Metformin). Her weight is stable. She is following a generally healthy diet. When asked about meal planning, she reported none. She has had a previous visit with a dietitian. She participates in exercise daily. (She presents today for her consultation with no meter or logs to review.  Her POCT A1c today is 5.8%, improving from 6 % previously.  She monitors glucose generally twice daily.  She drinks water and coffee with creamer and eats 2-3 meals per day plus a snack.  She does engage in routine physical activity going to the gym nearly every day.  She is UTD on eye exam, has never seen podiatry in the past.) An ACE inhibitor/angiotensin II receptor blocker is being taken. She does not see a podiatrist.Eye exam is current.     Review of systems  Constitutional: + Minimally fluctuating body weight, current Body mass index is 38.45 kg/m., no fatigue, no subjective hyperthermia, no subjective hypothermia Eyes: no blurry vision, no xerophthalmia ENT: no sore throat, no nodules palpated in throat, no dysphagia/odynophagia, no hoarseness Cardiovascular: no chest pain, no shortness of breath, no palpitations, no leg swelling Respiratory: no cough, no shortness of breath Gastrointestinal: no nausea/vomiting/diarrhea Musculoskeletal: no muscle/joint aches Skin: no rashes, no hyperemia, spot on left  plantar foot- painful Neurological: no tremors, no numbness, no tingling, no dizziness Psychiatric: no depression, no anxiety  Objective:     BP 121/75 (BP Location: Right Arm, Patient Position: Sitting, Cuff Size: Large)   Pulse 87   Ht 5' 2.5" (1.588 m)   Wt 213 lb 9.6 oz (96.9 kg)   BMI 38.45 kg/m   Wt Readings from Last 3 Encounters:  11/27/21 213 lb 9.6 oz (96.9 kg)  10/15/21 213 lb (96.6 kg)  09/08/21 213 lb (96.6 kg)     BP Readings from Last 3 Encounters:  11/27/21 121/75  10/15/21 130/83  07/15/21 129/87     Physical Exam- Limited  Constitutional:  Body mass index is 38.45 kg/m. , not in acute distress, normal state of mind Eyes:  EOMI, no exophthalmos Neck: Supple Cardiovascular: RRR, no murmurs, rubs, or gallops, no edema Respiratory: Adequate breathing efforts, no crackles, rales, rhonchi, or wheezing Musculoskeletal: no gross deformities, strength intact in all four extremities, no gross restriction of joint movements Skin:  no rashes, no hyperemia, probable plantar wart to right lateral foot Neurological: no tremor with outstretched hands   Diabetic Foot Exam - Simple   No data filed     CMP ( most recent) CMP     Component Value Date/Time   NA 139 04/10/2021 0940   K 4.3 04/10/2021 0940   CL 103 04/10/2021 0940   CO2 23 04/10/2021 0940   GLUCOSE 110 (H) 04/10/2021 0940   GLUCOSE 92 07/09/2016 1636   BUN 8 04/10/2021 0940   CREATININE 0.76 04/10/2021 0940   CREATININE 0.66 12/26/2013 0908   CALCIUM 9.7 04/10/2021 0940   PROT 7.1 04/10/2021 0940   ALBUMIN 4.6 04/10/2021 0940   AST 24 04/10/2021 0940   ALT 28 04/10/2021 0940   ALKPHOS 71 04/10/2021 0940   BILITOT <0.2 04/10/2021 0940   GFRNONAA 112 01/21/2020 0812   GFRAA 129 01/21/2020 0812     Diabetic Labs (most recent): Lab Results  Component Value Date   HGBA1C 5.8 (A) 11/27/2021   HGBA1C 6.0 (H)  07/15/2021   HGBA1C 6.5 (H) 04/10/2021     Lipid Panel ( most recent) Lipid  Panel     Component Value Date/Time   CHOL 181 04/10/2021 0940   TRIG 157 (H) 04/10/2021 0940   HDL 39 (L) 04/10/2021 0940   CHOLHDL 4.6 (H) 04/10/2021 0940   LDLCALC 114 (H) 04/10/2021 0940   LABVLDL 28 04/10/2021 0940      Lab Results  Component Value Date   TSH 2.070 01/21/2020   TSH 2.210 09/06/2018   TSH 2.370 01/03/2017   TSH 1.484 12/26/2013           Assessment & Plan:   1) Type 2 diabetes mellitus without complication, without long-term current use of insulin ([place])  She presents today for her consultation with no meter or logs to review.  Her POCT A1c today is 5.8%, improving from 6 % previously.  She monitors glucose generally twice daily.  She drinks water and coffee with creamer and eats 2-3 meals per day plus a snack.  She does engage in routine physical activity going to the gym nearly every day.  She is UTD on eye exam, has never seen podiatry in the past.  - Leonore Flitton has currently uncontrolled symptomatic type 2 DM since 31 years of age, with most recent A1c of 5.8 %.   -Recent labs reviewed.  - I had a long discussion with her about the progressive nature of diabetes and the pathology behind its complications. -her diabetes is not currently complicated but she remains at a high risk for more acute and chronic complications which include CAD, CVA, CKD, retinopathy, and neuropathy. These are all discussed in detail with her.  The following Lifestyle Medicine recommendations according to [facility]) were discussed and offered to patient and she agrees to start the journey:  A. Whole Foods, Plant-based plate comprising of fruits and vegetables, plant-based proteins, whole-grain carbohydrates was discussed in detail with the patient.   A list for source of those nutrients were also provided to the patient.  Patient will use only water or unsweetened tea for hydration. B.  The need to stay away from risky substances including  alcohol, smoking; obtaining 7 to 9 hours of restorative sleep, at least 150 minutes of moderate intensity exercise weekly, the importance of healthy social connections,  and stress reduction techniques were discussed. C.  A full color page of  Calorie density of various food groups per pound showing examples of each food groups was provided to the patient.  - I have counseled her on diet and weight management by adopting a carbohydrate restricted/protein [place] diet. Patient is encouraged to switch to unprocessed or minimally processed complex starch and increased protein intake (animal or plant source), fruits, and vegetables. -  she is advised to stick to a routine mealtimes to eat 3 meals a day and avoid unnecessary snacks (to snack only to correct hypoglycemia).   - she acknowledges that there is a room for improvement in her food and drink choices. - Suggestion is made for her to avoid simple carbohydrates from her diet including Cakes, Sweet Desserts, Ice Cream, Soda (diet and regular), Sweet Tea, Candies, Chips, Cookies, Store Bought Juices, Alcohol in Excess of 1-2 drinks a day, Artificial Sweeteners, Coffee Creamer, and "Sugar-free" Products. This will help patient to have more stable blood glucose profile and potentially avoid unintended weight gain.  - she has been seen by Jearld Fenton, RDN, CDE for diabetes education is continuing  to follow with her.  - I have approached her with the following individualized plan to manage her diabetes and patient agrees:    -she is encouraged to continue monitoring blood glucose at least once daily, before breakfast, and to call the clinic if she has readings less than 70 or above 300 for 3 tests in a row.  - she is warned not to take insulin without proper monitoring per orders. - Adjustment parameters are given to her for hypo and hyperglycemia in writing.  - she is advised to continue Metformin but will change to ER form to increase compliance,  therapeutically suitable for patient.  Start Metformin 500 mg ER daily with breakfast.   - she will be considered for incretin therapy as appropriate next visit.  - Specific targets for  A1c; LDL, HDL, and Triglycerides were discussed with the patient.  2) Blood Pressure /Hypertension:  her blood pressure is controlled to target.   she is advised to continue her current medications including Losartan 50 mg p.o. daily with breakfast.  3) Lipids/Hyperlipidemia:    Three is no recent lipid panel to review.  She did just have this drawn by her PCP this morning.  she is advised to continue Simvastatin 10 mg daily at bedtime.  Side effects and precautions discussed with her.  4)  Weight/Diet:  her Body mass index is 38.45 kg/m.  -  clearly complicating her diabetes care.   she is a candidate for weight loss. I discussed with her the fact that loss of 5 - 10% of her  current body weight will have the most impact on her diabetes management.  Exercise, and detailed carbohydrates information provided  -  detailed on discharge instructions.  5) Chronic Care/Health Maintenance: -she is on ACEI/ARB and Statin medications and is encouraged to initiate and continue to follow up with Ophthalmology, Dentist, Podiatrist at least yearly or according to recommendations, and advised to [place]. I have recommended yearly flu vaccine and pneumonia vaccine at least every 5 years; moderate intensity exercise for up to 150 minutes weekly; and sleep for at least 7 hours a day.  - she is advised to maintain close follow up with Patient, No Pcp Per for primary care needs, as well as her other providers for optimal and coordinated care.   - Time spent in this patient care: 60 min, of which > 50% was spent in counseling her about her diabetes and the rest reviewing her blood glucose logs, discussing her hypoglycemia and hyperglycemia episodes, reviewing her current and previous labs/studies (including abstraction from  other facilities) and medications doses and developing a long term treatment plan based on the latest standards of care/guidelines; and documenting her care.    Please refer to Patient Instructions for Blood Glucose Monitoring and Insulin/Medications Dosing Guide" in media tab for additional information. Please also refer to "Patient Self Inventory" in the Media tab for reviewed elements of pertinent patient history.  Neima Patel participated in the discussions, expressed understanding, and voiced agreement with the above plans.  All questions were answered to her satisfaction. she is encouraged to contact clinic should she have any questions or concerns prior to her return visit.     Follow up plan: - Return in about 3 months (around 02/26/2022) for Diabetes F/U with A1c in office, No previsit labs, Bring meter and logs.    Rayetta Pigg, [facility] [address] [place], [place] [address] Phone: [phone] Fax: [phone]  11/27/2021, 9:47 AM

## 2021-11-27 NOTE — Patient Instructions (Signed)

## 2021-11-28 LAB — COMPREHENSIVE METABOLIC PANEL
ALT: 21 IU/L (ref 0–32)
AST: 25 IU/L (ref 0–40)
Albumin/Globulin Ratio: 1.8 (ref 1.2–2.2)
Albumin: 4.2 g/dL (ref 3.9–4.9)
Alkaline Phosphatase: 62 IU/L (ref 44–121)
BUN/Creatinine Ratio: 11 (ref 9–23)
BUN: 8 mg/dL (ref 6–20)
Bilirubin Total: 0.2 mg/dL (ref 0.0–1.2)
CO2: 22 mmol/L (ref 20–29)
Calcium: 9.2 mg/dL (ref 8.7–10.2)
Chloride: 105 mmol/L (ref 96–106)
Creatinine, Ser: 0.74 mg/dL (ref 0.57–1.00)
Globulin, Total: 2.4 g/dL (ref 1.5–4.5)
Glucose: 96 mg/dL (ref 70–99)
Potassium: 4.3 mmol/L (ref 3.5–5.2)
Sodium: 143 mmol/L (ref 134–144)
Total Protein: 6.6 g/dL (ref 6.0–8.5)
eGFR: 109 mL/min/{1.73_m2} (ref >59)

## 2021-11-28 LAB — LIPID PANEL
Chol/HDL Ratio: 3.4 ratio (ref 0.0–4.4)
Cholesterol, Total: 167 mg/dL (ref 100–199)
HDL: 49 mg/dL (ref >39)
LDL Chol Calc (NIH): 94 mg/dL (ref 0–99)
Triglycerides: 133 mg/dL (ref 0–149)
VLDL Cholesterol Cal: 24 mg/dL (ref 5–40)

## 2021-11-28 LAB — HEMOGLOBIN A1C
Est. average glucose Bld gHb Est-mCnc: 126 mg/dL
Hgb A1c MFr Bld: 6 % — ABNORMAL HIGH (ref 4.8–5.6)

## 2021-11-30 ENCOUNTER — Ambulatory Visit (HOSPITAL_COMMUNITY): Payer: 59 | Admitting: Clinical

## 2021-12-01 ENCOUNTER — Telehealth (INDEPENDENT_AMBULATORY_CARE_PROVIDER_SITE_OTHER): Payer: 59 | Admitting: Psychiatry

## 2021-12-01 DIAGNOSIS — F332 Major depressive disorder, recurrent severe without psychotic features: Secondary | ICD-10-CM | POA: Diagnosis not present

## 2021-12-01 DIAGNOSIS — F411 Generalized anxiety disorder: Secondary | ICD-10-CM | POA: Diagnosis not present

## 2021-12-01 DIAGNOSIS — F17200 Nicotine dependence, unspecified, uncomplicated: Secondary | ICD-10-CM

## 2021-12-01 DIAGNOSIS — F4310 Post-traumatic stress disorder, unspecified: Secondary | ICD-10-CM | POA: Diagnosis not present

## 2021-12-01 DIAGNOSIS — F17210 Nicotine dependence, cigarettes, uncomplicated: Secondary | ICD-10-CM | POA: Diagnosis not present

## 2021-12-01 DIAGNOSIS — F410 Panic disorder [episodic paroxysmal anxiety] without agoraphobia: Secondary | ICD-10-CM
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
## 2021-12-01 NOTE — Patient Instructions (Signed)
No medication changes today. Try to drink more water in order to see if the dizziness is related to dehydration (orthostatic hypotension) or a side effect from the lexapro. If this improves with hydration, then we can increase the lexapro at your next visit.

## 2021-12-09 ENCOUNTER — Ambulatory Visit: Payer: 59 | Admitting: Nutrition

## 2021-12-10 ENCOUNTER — Ambulatory Visit: Payer: 59 | Admitting: Adult Health

## 2021-12-15 ENCOUNTER — Telehealth (INDEPENDENT_AMBULATORY_CARE_PROVIDER_SITE_OTHER): Payer: 59 | Admitting: Psychiatry

## 2021-12-15 ENCOUNTER — Encounter (HOSPITAL_COMMUNITY): Payer: Self-pay | Admitting: Psychiatry

## 2021-12-15 DIAGNOSIS — F332 Major depressive disorder, recurrent severe without psychotic features: Secondary | ICD-10-CM | POA: Diagnosis not present

## 2021-12-15 DIAGNOSIS — F411 Generalized anxiety disorder: Secondary | ICD-10-CM | POA: Diagnosis not present

## 2021-12-15 DIAGNOSIS — F4310 Post-traumatic stress disorder, unspecified: Secondary | ICD-10-CM

## 2021-12-15 DIAGNOSIS — F410 Panic disorder [episodic paroxysmal anxiety] without agoraphobia: Secondary | ICD-10-CM

## 2021-12-15 DIAGNOSIS — F17210 Nicotine dependence, cigarettes, uncomplicated: Secondary | ICD-10-CM | POA: Diagnosis not present

## 2021-12-15 DIAGNOSIS — F17200 Nicotine dependence, unspecified, uncomplicated: Secondary | ICD-10-CM

## 2021-12-15 MED ORDER — BUSPIRONE HCL 7.5 MG PO TABS: 7.5000 mg | ORAL_TABLET | Freq: Two times a day (BID) | ORAL | 1 refills | Status: DC

## 2021-12-15 MED ORDER — CITALOPRAM HYDROBROMIDE 10 MG PO TABS: 10.0000 mg | ORAL_TABLET | Freq: Every evening | ORAL | 0 refills | Status: DC

## 2021-12-15 NOTE — Patient Instructions (Signed)
We updated your buspar prescription to reflect twice daily dosing. Overall made no other medication changes. Be sure to continue hydrating well and that should help with the dizziness. If you are able to get an ecg through your primary care provider in the coming weeks that would be helpful for monitoring celexa's impact on your heart (qtc interval).
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
## 2021-12-17 ENCOUNTER — Encounter: Payer: 59 | Admitting: Nutrition

## 2021-12-21 ENCOUNTER — Ambulatory Visit (INDEPENDENT_AMBULATORY_CARE_PROVIDER_SITE_OTHER): Payer: 59 | Admitting: Psychiatry

## 2021-12-21 DIAGNOSIS — F332 Major depressive disorder, recurrent severe without psychotic features: Secondary | ICD-10-CM
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
## 2021-12-29 ENCOUNTER — Encounter (HOSPITAL_COMMUNITY): Payer: Self-pay | Admitting: Psychiatry

## 2021-12-29 ENCOUNTER — Telehealth (INDEPENDENT_AMBULATORY_CARE_PROVIDER_SITE_OTHER): Payer: 59 | Admitting: Psychiatry

## 2021-12-29 DIAGNOSIS — F17210 Nicotine dependence, cigarettes, uncomplicated: Secondary | ICD-10-CM | POA: Diagnosis not present

## 2021-12-29 DIAGNOSIS — F4310 Post-traumatic stress disorder, unspecified: Secondary | ICD-10-CM

## 2021-12-29 DIAGNOSIS — F17200 Nicotine dependence, unspecified, uncomplicated: Secondary | ICD-10-CM

## 2021-12-29 DIAGNOSIS — F411 Generalized anxiety disorder: Secondary | ICD-10-CM

## 2021-12-29 DIAGNOSIS — F410 Panic disorder [episodic paroxysmal anxiety] without agoraphobia: Secondary | ICD-10-CM

## 2021-12-29 DIAGNOSIS — F332 Major depressive disorder, recurrent severe without psychotic features: Secondary | ICD-10-CM | POA: Diagnosis not present

## 2021-12-29 MED ORDER — CITALOPRAM HYDROBROMIDE 10 MG PO TABS: 10.0000 mg | ORAL_TABLET | Freq: Every evening | ORAL | 1 refills | Status: DC
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
## 2021-12-29 NOTE — Patient Instructions (Signed)
No medication changes today. Keep up the good work with cutting back on smoking! If you can, establish care with a PCP so we can get an ecg.

## 2022-01-03 ENCOUNTER — Other Ambulatory Visit: Payer: Self-pay | Admitting: Adult Health

## 2022-01-07 ENCOUNTER — Ambulatory Visit (INDEPENDENT_AMBULATORY_CARE_PROVIDER_SITE_OTHER): Payer: 59 | Admitting: Adult Health

## 2022-01-07 ENCOUNTER — Encounter: Payer: Self-pay | Admitting: Adult Health

## 2022-01-07 VITALS — BP 121/85 | HR 78 | Ht 62.25 in | Wt 217.0 lb

## 2022-01-07 DIAGNOSIS — F419 Anxiety disorder, unspecified: Secondary | ICD-10-CM

## 2022-01-07 DIAGNOSIS — F32A Depression, unspecified: Secondary | ICD-10-CM

## 2022-01-07 NOTE — Progress Notes (Signed)
Subjective:     Patient ID: Rachel Higgins, female   DOB: 1990-09-27, 31 y.o.   MRN: [id number]  HPI Errin is a 31 year old white  female,single, G0P0, back in follow up on increasing Buspar an Zoloft and getting BH referral, she was taking off oloft and placed on Celexa and Buspar is same at 7.5 mg and she is feeling better. She is seeing therapist named Gigi Gin and Dr Adrian Blackwater is virtual.  She has follow up with [facility] the end of thsi month for diabetes, last A1c was 5.8 on 11/27/21. She is smiling today and says she has new friend, just talking for now.      Component Value Date/Time   DIAGPAP  10/15/2021 0844    - Negative for intraepithelial lesion or malignancy (NILM)   DIAGPAP  08/30/2018 0000    NEGATIVE FOR INTRAEPITHELIAL LESIONS OR MALIGNANCY.   HPVHIGH Negative 10/15/2021 0844   ADEQPAP  10/15/2021 0844    Satisfactory for evaluation; transformation zone component PRESENT.   ADEQPAP  08/30/2018 0000    Satisfactory for evaluation  endocervical/transformation zone component PRESENT.   Review of Systems Feeling better Reviewed past medical,surgical, social and family history. Reviewed medications and allergies.     Objective:   Physical Exam BP 121/85 (BP Location: Left Arm, Patient Position: Sitting, Cuff Size: Normal)   Pulse 78   Ht 5' 2.25" (1.581 m)   Wt 217 lb (98.4 kg)   LMP 01/03/2022   BMI 39.37 kg/m  Skin warm and dry.Lungs: clear to ausculation bilaterally. Cardiovascular: regular rate and rhythm.    Fall risk is low    01/07/2022    8:55 AM 11/09/2021    1:19 PM 11/03/2021   11:19 AM  Depression screen PHQ 2/9  Decreased Interest 1    Down, Depressed, Hopeless 1    PHQ - 2 Score 2    Altered sleeping 1    Tired, decreased energy 1    Change in appetite 1    Feeling bad or failure about yourself  1    Trouble concentrating 3    Moving slowly or fidgety/restless 3    Suicidal thoughts 0    PHQ-9 Score 12    Difficult doing work/chores Not difficult at all        Information is confidential and restricted. Go to Review Flowsheets to unlock data.       01/07/2022    8:57 AM 11/09/2021    1:17 PM 10/15/2021    8:37 AM 07/15/2021    9:40 AM  GAD 7 : Generalized Anxiety Score  Nervous, Anxious, on Edge 2  2 3   Control/stop worrying 2  2 2   Worry too much - different things 2  2 2   Trouble relaxing 1  2 2   Restless 2  2 1   Easily annoyed or irritable 1  2 1   Afraid - awful might happen 1  1 2   Total GAD 7 Score 11  13 13   Anxiety Difficulty Somewhat difficult        Information is confidential and restricted. Go to Review Flowsheets to unlock data.    [facility] - 01/07/22 0853       Pregnancy Intention Screening   Does the patient want to become pregnant in the next year? No    Does the patient's partner want to become pregnant in the next year? No    Would the patient like to discuss contraceptive options today? No  Contraception Wrap Up   Current Method Oral Contraceptive    End Method Oral Contraceptive               Assessment:      1. Anxiety and depression Feeling better she says  Continue seeing BH Continue meds per Dr Nehemiah Settle    Plan:     Follow up in 6 months of sooner if needed

## 2022-01-19 ENCOUNTER — Telehealth (INDEPENDENT_AMBULATORY_CARE_PROVIDER_SITE_OTHER): Payer: 59 | Admitting: Psychiatry

## 2022-01-19 ENCOUNTER — Encounter (HOSPITAL_COMMUNITY): Payer: Self-pay | Admitting: Psychiatry

## 2022-01-19 DIAGNOSIS — F4310 Post-traumatic stress disorder, unspecified: Secondary | ICD-10-CM

## 2022-01-19 DIAGNOSIS — F17210 Nicotine dependence, cigarettes, uncomplicated: Secondary | ICD-10-CM

## 2022-01-19 DIAGNOSIS — F410 Panic disorder [episodic paroxysmal anxiety] without agoraphobia: Secondary | ICD-10-CM

## 2022-01-19 DIAGNOSIS — F332 Major depressive disorder, recurrent severe without psychotic features: Secondary | ICD-10-CM | POA: Diagnosis not present

## 2022-01-19 DIAGNOSIS — F411 Generalized anxiety disorder: Secondary | ICD-10-CM

## 2022-01-19 DIAGNOSIS — F17200 Nicotine dependence, unspecified, uncomplicated: Secondary | ICD-10-CM

## 2022-01-19 MED ORDER — BUSPIRONE HCL 7.5 MG PO TABS: 7.5000 mg | ORAL_TABLET | Freq: Two times a day (BID) | ORAL | 2 refills | Status: DC

## 2022-01-19 MED ORDER — CITALOPRAM HYDROBROMIDE 10 MG PO TABS: 10.0000 mg | ORAL_TABLET | Freq: Every evening | ORAL | 2 refills | Status: DC
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
## 2022-01-19 NOTE — Patient Instructions (Signed)
We didn't make any changes to your medication today. Keep up the good work with reducing the amount your are smoking and sticking up for yourself! When you can, try to go on and schedule an appointment with a PCP in the New Year so we can get that ecg.

## 2022-01-20 ENCOUNTER — Ambulatory Visit (INDEPENDENT_AMBULATORY_CARE_PROVIDER_SITE_OTHER): Payer: 59 | Admitting: Psychiatry

## 2022-01-20 DIAGNOSIS — F332 Major depressive disorder, recurrent severe without psychotic features: Secondary | ICD-10-CM
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
## 2022-02-01 ENCOUNTER — Other Ambulatory Visit: Payer: Self-pay | Admitting: Adult Health

## 2022-02-04 ENCOUNTER — Ambulatory Visit (HOSPITAL_COMMUNITY): Payer: 59 | Admitting: Psychiatry

## 2022-02-18 ENCOUNTER — Ambulatory Visit (INDEPENDENT_AMBULATORY_CARE_PROVIDER_SITE_OTHER): Payer: 59 | Admitting: Psychiatry

## 2022-02-18 DIAGNOSIS — F332 Major depressive disorder, recurrent severe without psychotic features: Secondary | ICD-10-CM
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
## 2022-02-24 ENCOUNTER — Encounter (HOSPITAL_COMMUNITY): Payer: Self-pay | Admitting: Psychiatry

## 2022-02-24 ENCOUNTER — Telehealth (INDEPENDENT_AMBULATORY_CARE_PROVIDER_SITE_OTHER): Payer: 59 | Admitting: Psychiatry

## 2022-02-24 DIAGNOSIS — F17200 Nicotine dependence, unspecified, uncomplicated: Secondary | ICD-10-CM

## 2022-02-24 DIAGNOSIS — F411 Generalized anxiety disorder: Secondary | ICD-10-CM

## 2022-02-24 DIAGNOSIS — F331 Major depressive disorder, recurrent, moderate: Secondary | ICD-10-CM

## 2022-02-24 DIAGNOSIS — F4310 Post-traumatic stress disorder, unspecified: Secondary | ICD-10-CM

## 2022-02-24 DIAGNOSIS — F410 Panic disorder [episodic paroxysmal anxiety] without agoraphobia: Secondary | ICD-10-CM

## 2022-02-24 MED ORDER — CITALOPRAM HYDROBROMIDE 10 MG PO TABS: 10.0000 mg | ORAL_TABLET | Freq: Every evening | ORAL | 2 refills | Status: DC

## 2022-02-24 MED ORDER — BUSPIRONE HCL 7.5 MG PO TABS: 7.5000 mg | ORAL_TABLET | Freq: Two times a day (BID) | ORAL | 2 refills | Status: DC

## 2022-02-24 NOTE — Patient Instructions (Signed)
We did not make any changes to her medication today.  Continue to try to cut back on the amount you are smoking and we are happy to provide nicotine replacement if this is something her interested in.  Keep up the good work in psychotherapy.  Once you are with a new primary care provider we will try to coordinate getting an ECG for you.

## 2022-02-24 NOTE — Patient Instructions (Incomplete)
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
## 2022-02-26 ENCOUNTER — Ambulatory Visit: Payer: 59 | Admitting: Nurse Practitioner

## 2022-03-15 ENCOUNTER — Ambulatory Visit (INDEPENDENT_AMBULATORY_CARE_PROVIDER_SITE_OTHER): Payer: 59 | Admitting: Psychiatry

## 2022-03-15 DIAGNOSIS — F331 Major depressive disorder, recurrent, moderate: Secondary | ICD-10-CM

## 2022-03-15 DIAGNOSIS — F4310 Post-traumatic stress disorder, unspecified: Secondary | ICD-10-CM
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
## 2022-03-16 NOTE — Patient Instructions (Incomplete)

## 2022-03-18 ENCOUNTER — Ambulatory Visit: Payer: 59 | Admitting: Nurse Practitioner

## 2022-03-18 ENCOUNTER — Telehealth: Payer: Self-pay | Admitting: Nurse Practitioner

## 2022-03-18 DIAGNOSIS — E119 Type 2 diabetes mellitus without complications: Secondary | ICD-10-CM

## 2022-03-18 MED ORDER — ACCU-CHEK GUIDE W/DEVICE KIT: PACK | 0 refills | Status: AC

## 2022-03-18 NOTE — Telephone Encounter (Signed)
Ill send it in

## 2022-03-18 NOTE — Telephone Encounter (Signed)
Pt said her dog ate her meter, can you send in a new script for Accu Chek Guide. [place] [facility]

## 2022-03-31 IMAGING — US US BREAST*L* LIMITED INC AXILLA
1 series · 12 of 12 positions shown · non-contrast
Comparison: None.

CLINICAL DATA: 30-year-old female with pain involving the outer
left breast.

EXAM:
DIGITAL DIAGNOSTIC BILATERAL MAMMOGRAM WITH TOMOSYNTHESIS AND CAD;
ULTRASOUND LEFT BREAST LIMITED
TECHNIQUE: Bilateral digital diagnostic mammography and breast tomosynthesis
was performed. The images were evaluated with computer-aided
detection.; Targeted ultrasound examination of the left breast was
performed.

[Series 1: us breast*left* limited inc axilla · 0.08mm/px · 12 of 12 slices shown]
[im 1/12]
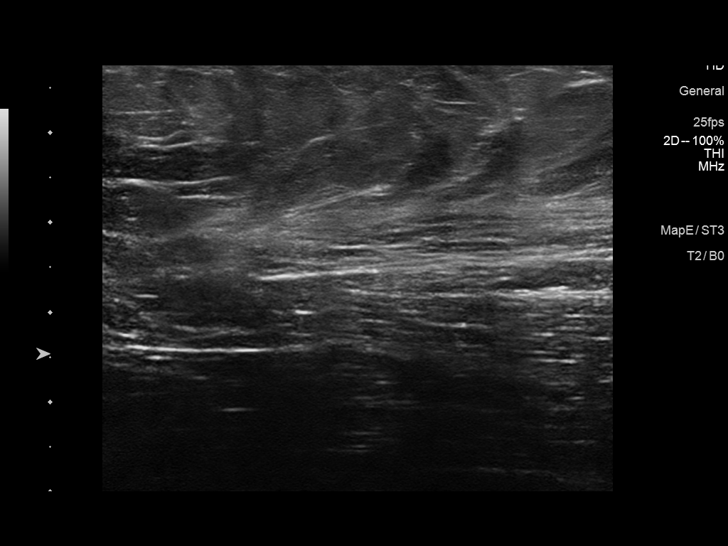
[im 2/12]
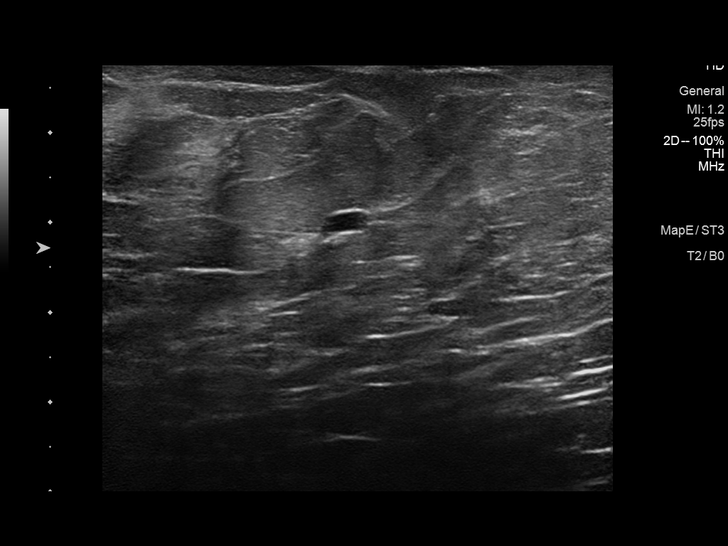
[im 3/12]
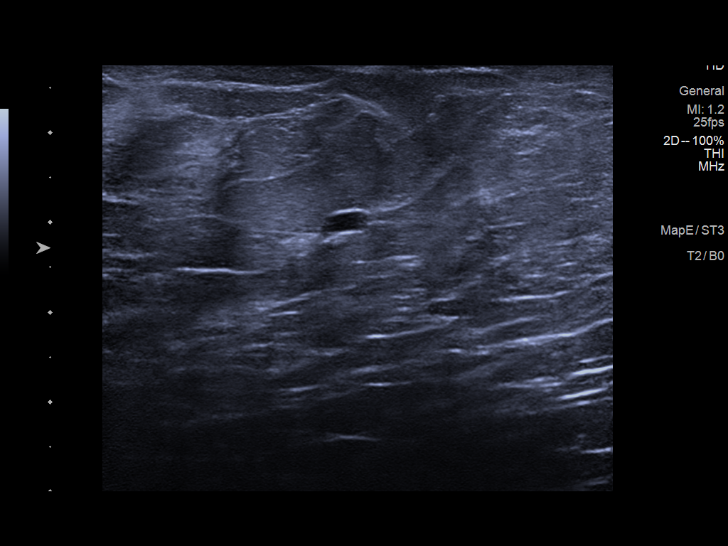
[im 4/12]
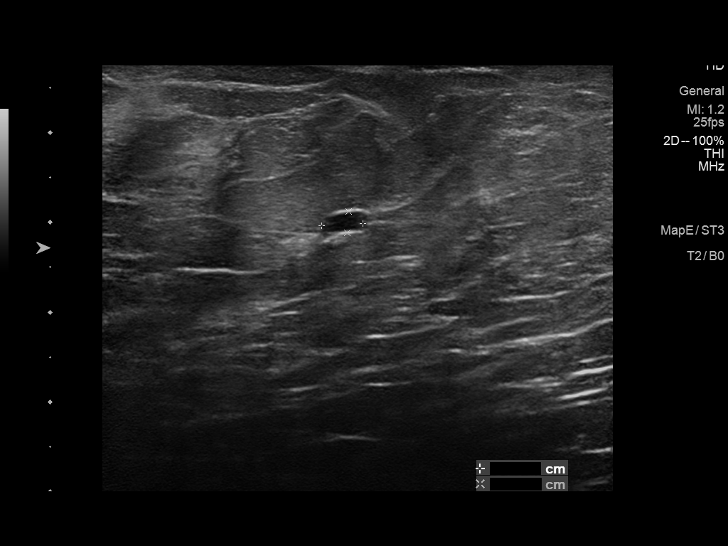
[im 5/12]
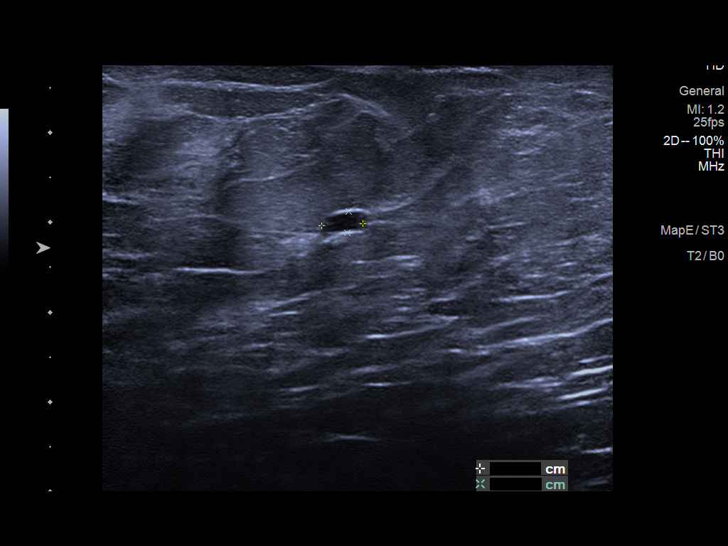
[im 6/12]
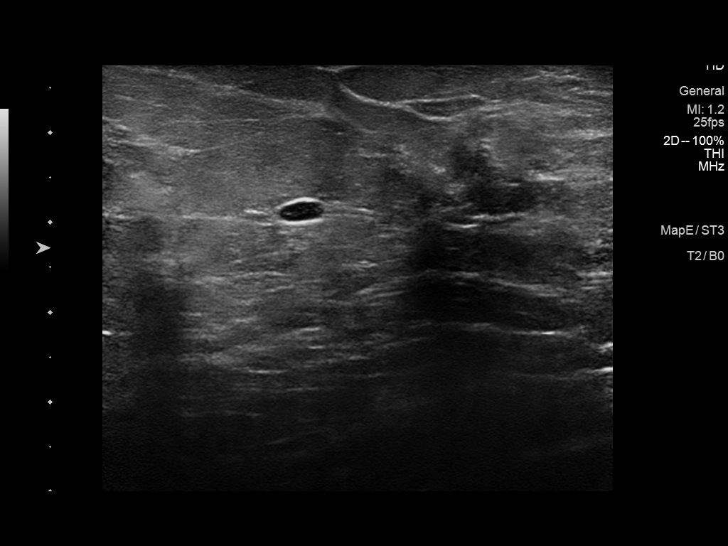
[im 7/12]
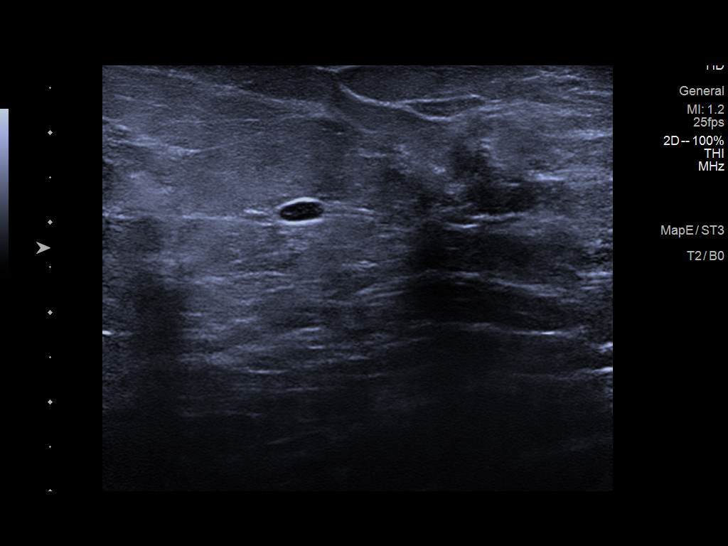
[im 8/12]
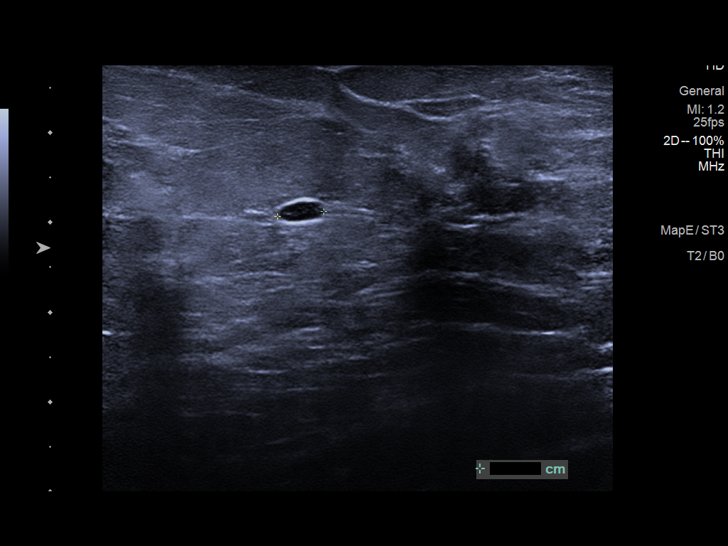
[im 9/12]
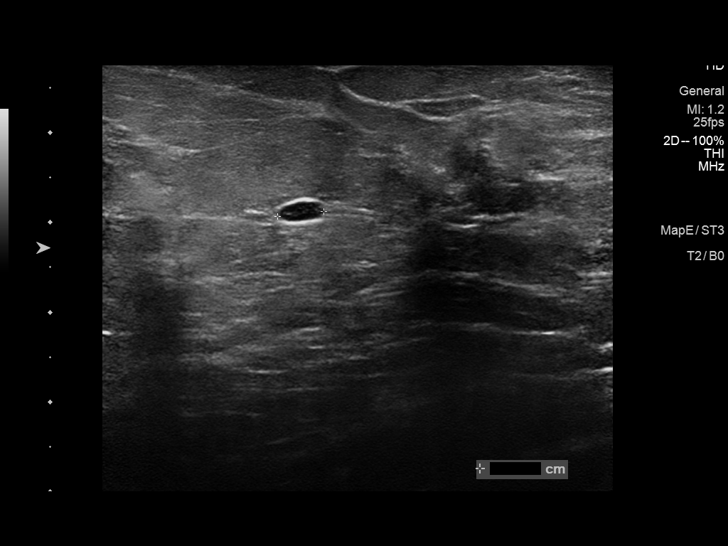
[im 10/12]
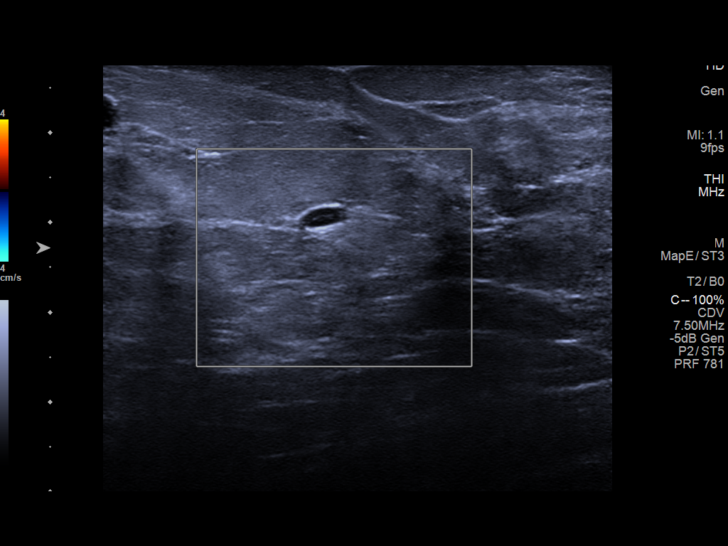
[im 11/12]
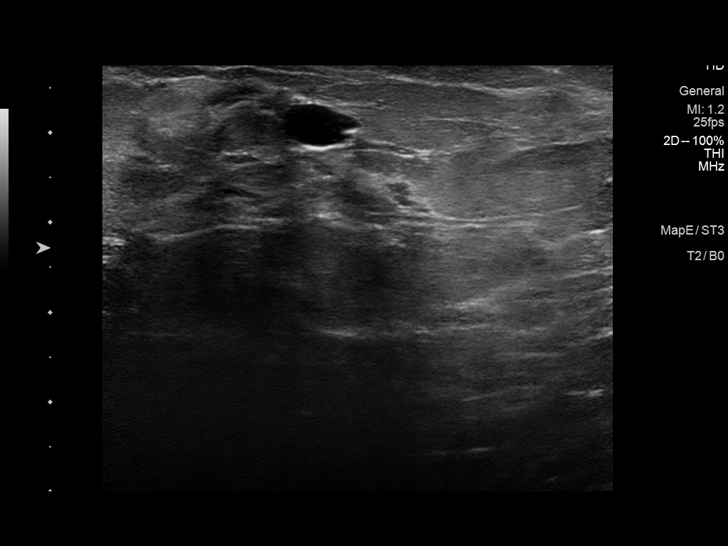
[im 12/12]
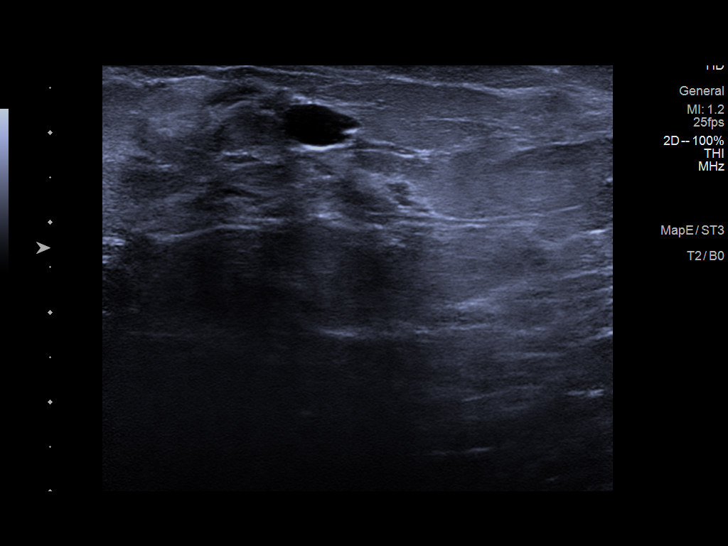

[12 of 12 positions shown; findings below may reference images not displayed]

ACR Breast Density Category b: There are scattered areas of
fibroglandular density.
FINDINGS: No suspicious masses or calcifications seen in the right breast.
Spot compression tomograms were performed over the area of
tenderness in the outer left breast with a tiny oval circumscribed
mass measuring 0.2 cm and an additional oval circumscribed mass
measuring 0.4 cm.

Targeted ultrasound of the left breast was performed. No suspicious
masses or abnormality seen in the region of concern in the outer
left breast at the approximate 3 o'clock position. Few scattered
tiny cysts are seen at 2 o'clock, 1 of which at 2 o'clock 4 cm from
nipple measures 0.5 x 0.2 x 0.5 cm.
IMPRESSION: 1. No suspicious mammographic or sonographic abnormalities at site
of tenderness in the outer left breast.

2.  No mammographic evidence of malignancy in either breast.

RECOMMENDATION:
Screening mammogram at age 38 unless there are persistent or
intervening clinical concerns. (Code:C0-9-K58)

I have discussed the findings and recommendations with the patient.
If applicable, a reminder letter will be sent to the patient
regarding the next appointment.

BI-RADS CATEGORY  2: Benign.

## 2022-03-31 IMAGING — MG DIGITAL DIAGNOSTIC BILAT W/ TOMO W/ CAD
6 of 10 series · 6 of 30 positions shown · non-contrast
Comparison: None.

CLINICAL DATA: 30-year-old female with pain involving the outer
left breast.

EXAM:
DIGITAL DIAGNOSTIC BILATERAL MAMMOGRAM WITH TOMOSYNTHESIS AND CAD;
ULTRASOUND LEFT BREAST LIMITED
TECHNIQUE: Bilateral digital diagnostic mammography and breast tomosynthesis
was performed. The images were evaluated with computer-aided
detection.; Targeted ultrasound examination of the left breast was
performed.

[L CC synth-2D (1 of 2)]
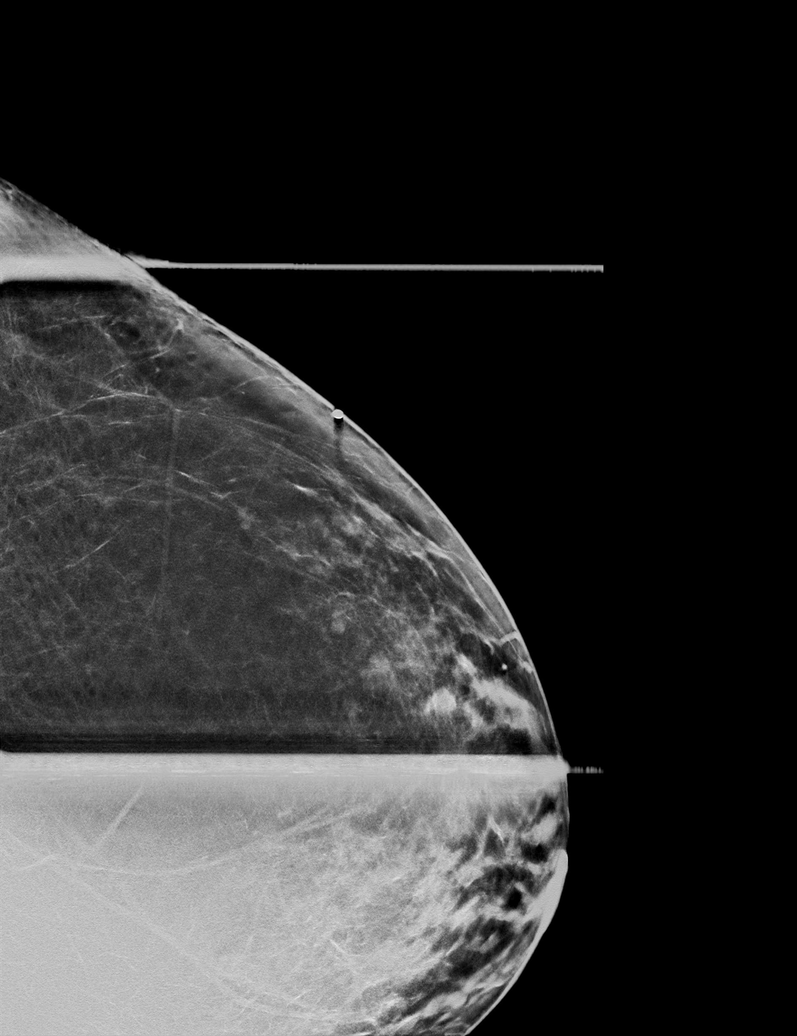

[R CC synth-2D]
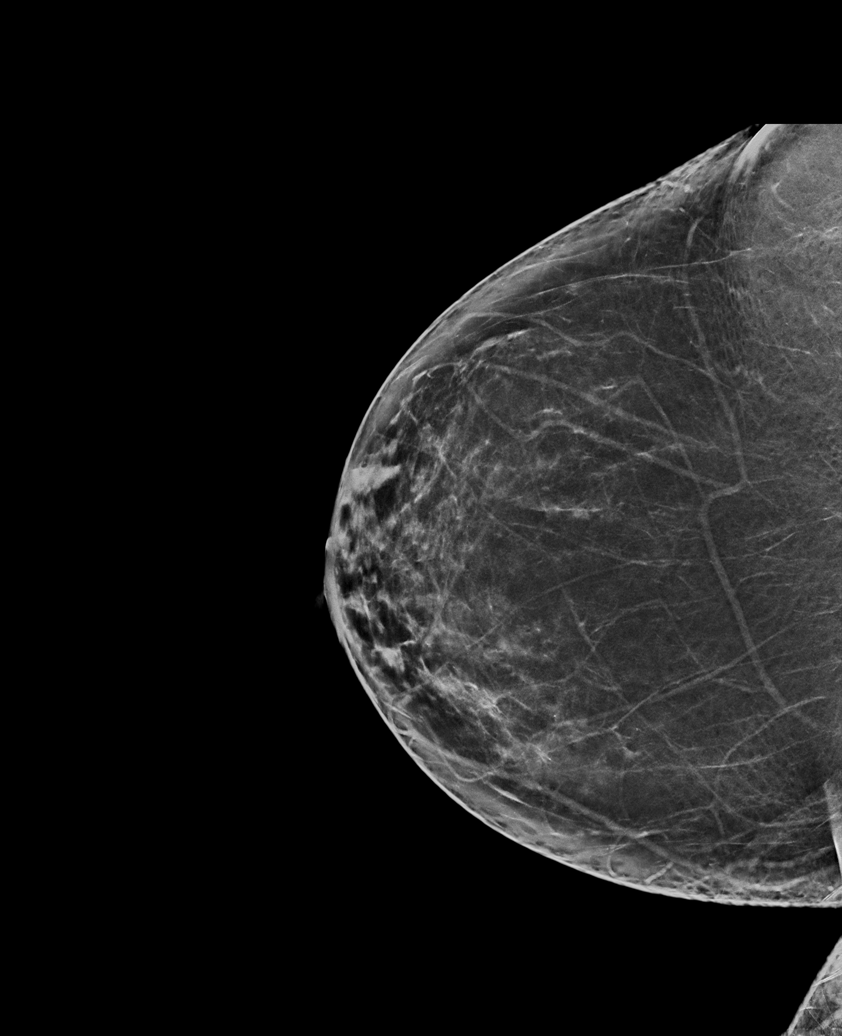

[L CC synth-2D (2 of 2)]
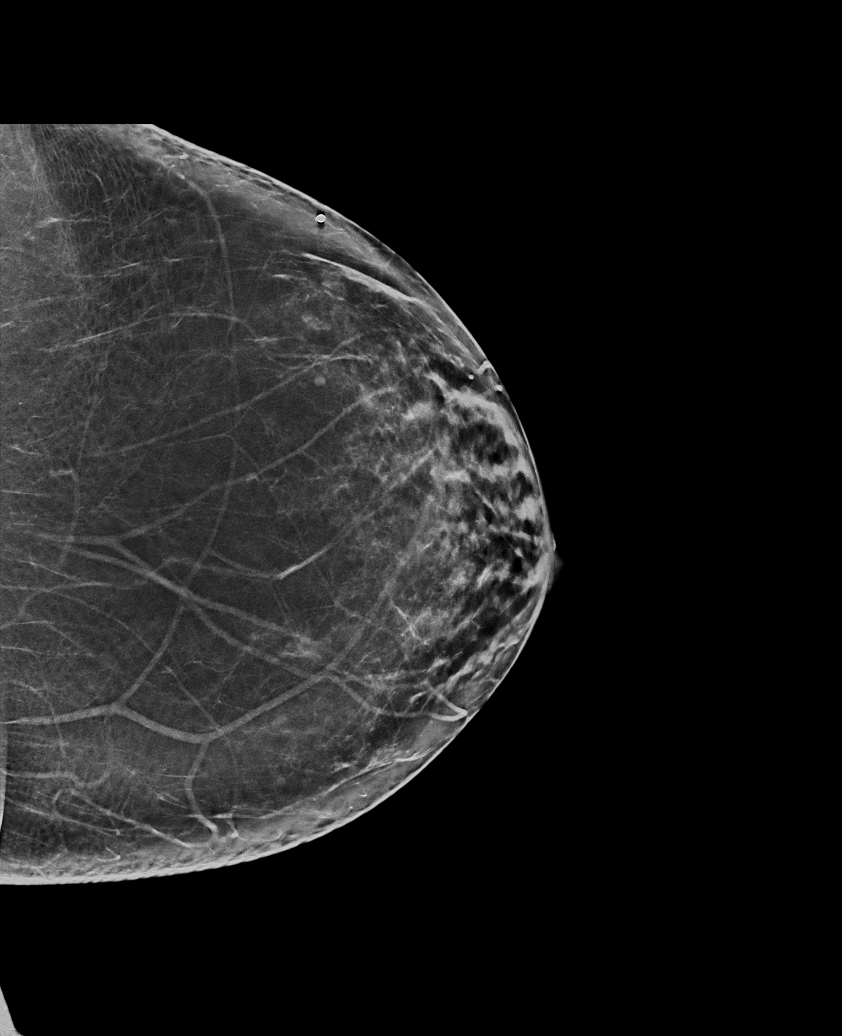

[R MLO synth-2D]
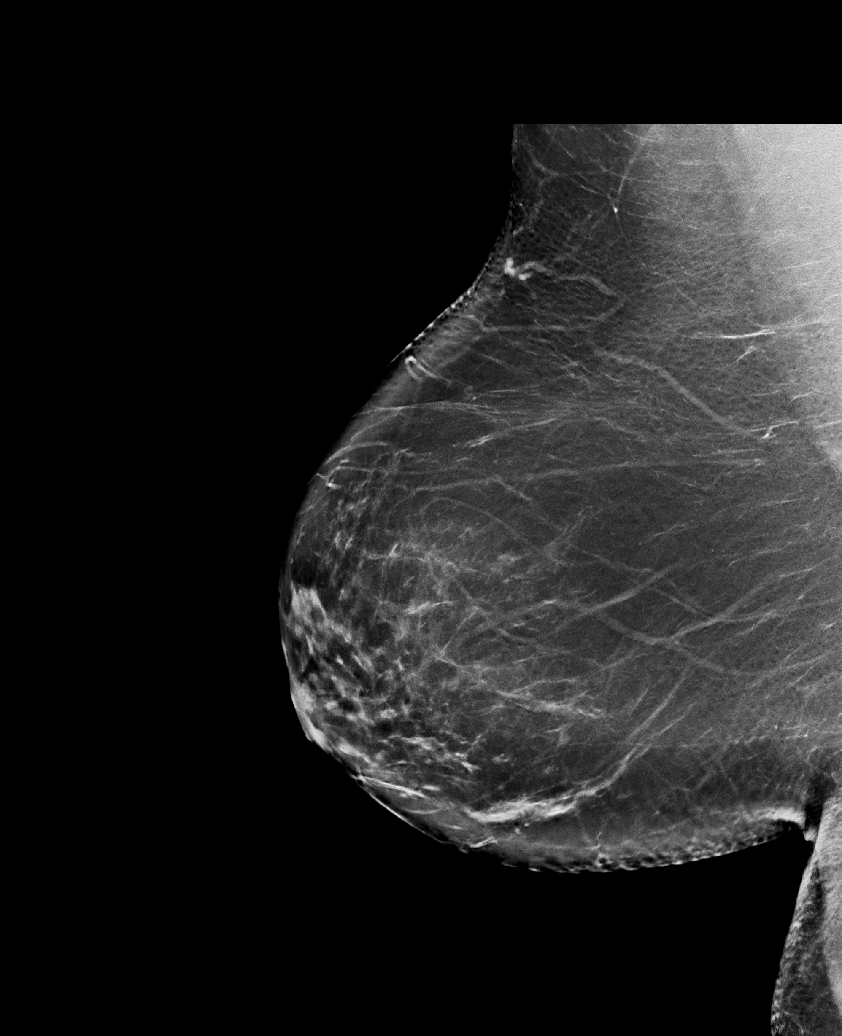

[L MLO synth-2D]
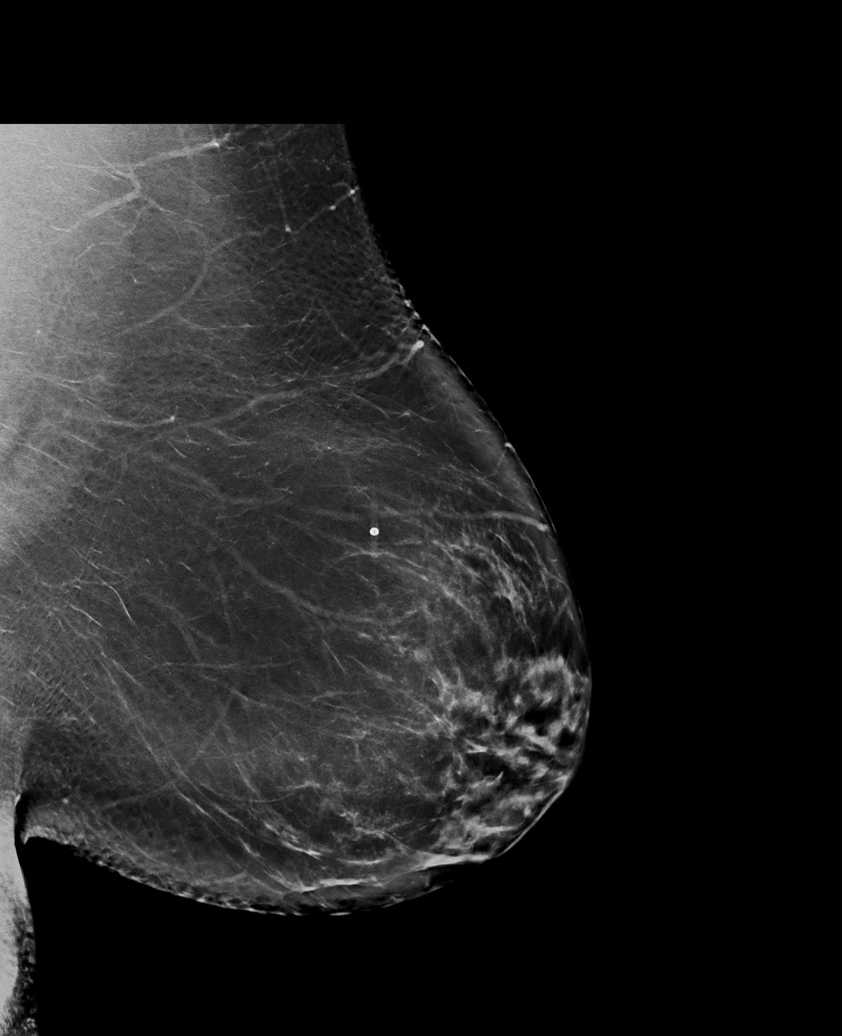

[L MLO tomo · tomo slice 49/98.0]
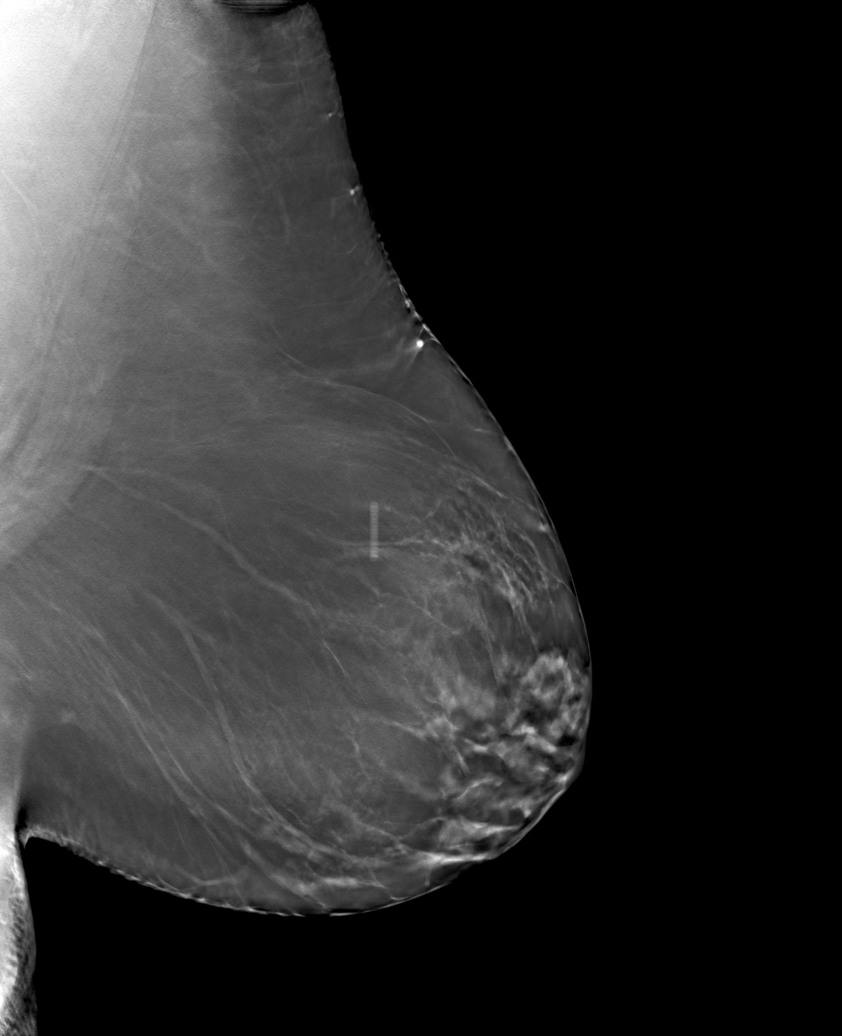

[6 of 30 positions shown; findings below may reference images not displayed]

ACR Breast Density Category b: There are scattered areas of
fibroglandular density.
FINDINGS: No suspicious masses or calcifications seen in the right breast.
Spot compression tomograms were performed over the area of
tenderness in the outer left breast with a tiny oval circumscribed
mass measuring 0.2 cm and an additional oval circumscribed mass
measuring 0.4 cm.

Targeted ultrasound of the left breast was performed. No suspicious
masses or abnormality seen in the region of concern in the outer
left breast at the approximate 3 o'clock position. Few scattered
tiny cysts are seen at 2 o'clock, 1 of which at 2 o'clock 4 cm from
nipple measures 0.5 x 0.2 x 0.5 cm.
IMPRESSION: 1. No suspicious mammographic or sonographic abnormalities at site
of tenderness in the outer left breast.

2.  No mammographic evidence of malignancy in either breast.

RECOMMENDATION:
Screening mammogram at age 38 unless there are persistent or
intervening clinical concerns. (Code:C0-9-K58)

I have discussed the findings and recommendations with the patient.
If applicable, a reminder letter will be sent to the patient
regarding the next appointment.

BI-RADS CATEGORY  2: Benign.

## 2022-04-12 ENCOUNTER — Ambulatory Visit (INDEPENDENT_AMBULATORY_CARE_PROVIDER_SITE_OTHER): Payer: 59 | Admitting: Psychiatry

## 2022-04-12 DIAGNOSIS — F4310 Post-traumatic stress disorder, unspecified: Secondary | ICD-10-CM

## 2022-04-12 DIAGNOSIS — F331 Major depressive disorder, recurrent, moderate: Secondary | ICD-10-CM | POA: Diagnosis not present
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
## 2022-04-14 ENCOUNTER — Ambulatory Visit: Payer: 59 | Admitting: Nurse Practitioner

## 2022-04-21 NOTE — Patient Instructions (Signed)

## 2022-04-23 ENCOUNTER — Ambulatory Visit (INDEPENDENT_AMBULATORY_CARE_PROVIDER_SITE_OTHER): Payer: 59 | Admitting: Nurse Practitioner

## 2022-04-23 ENCOUNTER — Encounter: Payer: Self-pay | Admitting: Nurse Practitioner

## 2022-04-23 VITALS — BP 122/82 | HR 84 | Ht 62.25 in | Wt 229.2 lb

## 2022-04-23 DIAGNOSIS — E119 Type 2 diabetes mellitus without complications: Secondary | ICD-10-CM

## 2022-04-23 LAB — POCT GLYCOSYLATED HEMOGLOBIN (HGB A1C): HbA1c, POC (controlled diabetic range): 6.3 % (ref 0.0–7.0)

## 2022-04-23 MED ORDER — METFORMIN HCL ER 500 MG PO TB24: 1000.0000 mg | ORAL_TABLET | Freq: Every day | ORAL | 3 refills | Status: DC

## 2022-04-23 NOTE — Progress Notes (Signed)
Endocrinology Follow Up Note       04/23/2022, 8:26 AM   Subjective:    Patient ID: Rachel Higgins, female    DOB: Aug 20, 1990.  Rachel Higgins is being seen in follow up after being seen in consultation for management of currently uncontrolled symptomatic diabetes requested by  Patient, No Pcp Per.   Past Medical History:  Diagnosis Date   Anxiety and depression 09/06/2019   Broken arm 1996   Contraceptive management 09/26/2012   Contraceptive management 09/26/2012   Encounter for well woman exam with routine gynecological exam 09/06/2019   Hematuria 07/03/2013   Hypertension 09/06/2019   Late period 05/18/2018   LLQ pain 05/18/2018   Migraine headache    Painful urination 07/03/2013   Painful urination 07/03/2013   Patient desires pregnancy 01/15/2020   Postcoital bleeding 12/30/2014   Postcoital bleeding 12/30/2014   Right ovarian cyst 07/13/2013   RUQ pain 07/20/2013   RUQ pain 07/20/2013   Has adenomyomatosis of GB refer to Dr Arnoldo Morale appt 5/28 at 10 am   Thyroid nodule    Tired 12/26/2013   Tired 12/26/2013   Type 2 diabetes mellitus without complication, without long-term current use of insulin ([place]) 04/10/2021   Unspecified symptom associated with female genital organs 07/03/2013    Past Surgical History:  Procedure Laterality Date   CHOLECYSTECTOMY N/A 09/03/2013   Procedure: LAPAROSCOPIC CHOLECYSTECTOMY;  Surgeon: Jamesetta So, MD;  Location: [facility];  Service: General;  Laterality: N/A;   WISDOM TOOTH EXTRACTION      Social History   Socioeconomic History   Marital status: Single    Spouse name: Not on file   Number of children: Not on file   Years of education: Not on file   Highest education level: Not on file  Occupational History   Not on file  Tobacco Use   Smoking status: Every Day    Packs/day: 0.50    Years: 20.00    Total pack years: 10.00    Types: Cigarettes, E-cigarettes   Smokeless tobacco:  Never   Tobacco comments:    form given 10-22-15. Also vapes at work. <0.5ppd as of 02/24/22  Vaping Use   Vaping Use: Every day  Substance and Sexual Activity   Alcohol use: No   Drug use: Not Currently   Sexual activity: Not Currently    Birth control/protection: Pill  Other Topics Concern   Not on file  Social History Narrative   Not on file   Social Determinants of Health   Financial Resource Strain: Medium Risk (10/15/2021)   Overall Financial Resource Strain (CARDIA)    Difficulty of Paying Living Expenses: Somewhat hard  Food Insecurity: No Food Insecurity (10/15/2021)   Hunger Vital Sign    Worried About Running Out of Food in the Last Year: Never true    Ran Out of Food in the Last Year: Never true  Transportation Needs: No Transportation Needs (10/15/2021)   PRAPARE - Hydrologist (Medical): No    Lack of Transportation (Non-Medical): No  Physical Activity: Sufficiently Active (10/15/2021)   Exercise Vital Sign    Days of Exercise per Week: 6 days  Minutes of Exercise per Session: 40 min  Stress: Stress Concern Present (10/15/2021)   [place]    Feeling of Stress : To some extent  Social Connections: Socially Isolated (10/15/2021)   Social Connection and Isolation Panel [NHANES]    Frequency of Communication with Friends and Family: Once a week    Frequency of Social Gatherings with Friends and Family: Never    Attends Religious Services: Never    Printmaker: No    Attends Music therapist: Never    Marital Status: Separated    Family History  Problem Relation Age of Onset   Vision loss Mother    Thyroid disease Maternal Aunt    Thyroid cancer Maternal Grandfather    Stroke Paternal Grandmother    Arthritis Paternal Grandmother    COPD Paternal Grandmother    Heart disease Paternal Grandmother    Breast cancer Paternal  Grandmother    Lung cancer Paternal Grandmother    Diabetes Paternal Grandfather    Hyperlipidemia Father    Arthritis Father    Prostate cancer Father    Esophageal cancer Neg Hx    Pancreatic cancer Neg Hx    Stomach cancer Neg Hx    Colon cancer Neg Hx    Liver disease Neg Hx     Outpatient Encounter Medications as of 04/23/2022  Medication Sig   ACCU-CHEK GUIDE test strip USE 1 STRIP TO CHECK GLUCOSE 4 TIMES DAILY   Accu-Chek Softclix Lancets lancets USE UP TO 4 TIMES DAILY AS DIRECTED   blood glucose meter kit and supplies KIT Dispense based on patient and insurance preference. Use up to four times daily as directed.   Blood Glucose Monitoring Suppl (ACCU-CHEK GUIDE) w/Device KIT Use to monitor glucose once daily   busPIRone (BUSPAR) 7.5 MG tablet Take 1 tablet (7.5 mg total) by mouth 2 (two) times daily.   Cholecalciferol 125 MCG (5000 UT) capsule Take 1 capsule (5,000 Units total) by mouth daily.   citalopram (CELEXA) 10 MG tablet Take 1 tablet (10 mg total) by mouth at bedtime.   losartan (COZAAR) 50 MG tablet TAKE 1 TABLET BY MOUTH ONCE DAILY - TIME FOR LABS   metFORMIN (GLUCOPHAGE-XR) 500 MG 24 hr tablet Take 2 tablets (1,000 mg total) by mouth daily with breakfast.   norethindrone (MICRONOR) 0.35 MG tablet Take 1 tablet (0.35 mg total) by mouth daily.   omeprazole (PRILOSEC) 40 MG capsule Take 1 capsule by mouth once daily   simvastatin (ZOCOR) 10 MG tablet Take 1 tablet by mouth once daily   vitamin B-12 (CYANOCOBALAMIN) 500 MCG tablet Take 500 mcg by mouth daily.   [DISCONTINUED] metFORMIN (GLUCOPHAGE-XR) 500 MG 24 hr tablet Take 1 tablet (500 mg total) by mouth daily with breakfast.   No facility-administered encounter medications on file as of 04/23/2022.    ALLERGIES: Allergies  Allergen Reactions   Adhesive [Tape]     Rash and burns skin     VACCINATION STATUS:  There is no immunization history on file for this patient.  Diabetes She presents for her  follow-up diabetic visit. She has type 2 diabetes mellitus. Her disease course has been stable. There are no hypoglycemic associated symptoms. There are no diabetic associated symptoms. There are no hypoglycemic complications. Symptoms are stable. There are no diabetic complications. Risk factors for coronary artery disease include diabetes mellitus, dyslipidemia, family history, obesity, hypertension and tobacco exposure. Current diabetic treatment includes  oral agent (monotherapy). She is compliant with treatment most of the time. Her weight is increasing steadily. She is following a generally healthy diet. When asked about meal planning, she reported none. She has had a previous visit with a dietitian. She participates in exercise daily. Her overall blood glucose range is 110-130 mg/dl. (She presents today with her meter, no logs, showing stable, mostly at goal fasting glycemic profile.  Her POCT A1c today is 6.3%, increasing from last visit of 5.8%.  She notes she has been more stressed recently, moved back in with her mom.  ) An ACE inhibitor/angiotensin II receptor blocker is being taken. She does not see a podiatrist.Eye exam is current.    Review of systems  Constitutional: + steadily increasing body weight,  current Body mass index is 41.59 kg/m. , no fatigue, no subjective hyperthermia, no subjective hypothermia Eyes: no blurry vision, no xerophthalmia ENT: no sore throat, no nodules palpated in throat, no dysphagia/odynophagia, no hoarseness Cardiovascular: no chest pain, no shortness of breath, no palpitations, no leg swelling Respiratory: no cough, no shortness of breath Gastrointestinal: no nausea/vomiting/diarrhea Musculoskeletal: no muscle/joint aches Skin: no rashes, no hyperemia Neurological: no tremors, no numbness, no tingling, no dizziness Psychiatric: no depression, no anxiety  Objective:     BP 122/82   Pulse 84   Ht 5' 2.25" (1.581 m)   Wt 229 lb 3.2 oz (104 kg)   BMI  41.59 kg/m   Wt Readings from Last 3 Encounters:  04/23/22 229 lb 3.2 oz (104 kg)  01/07/22 217 lb (98.4 kg)  11/27/21 213 lb 9.6 oz (96.9 kg)     BP Readings from Last 3 Encounters:  04/23/22 122/82  01/07/22 121/85  11/27/21 121/75      Physical Exam- Limited  Constitutional:  Body mass index is 41.59 kg/m. , not in acute distress, normal state of mind Eyes:  EOMI, no exophthalmos Musculoskeletal: no gross deformities, strength intact in all four extremities, no gross restriction of joint movements Skin:  no rashes, no hyperemia Neurological: no tremor with outstretched hands   Diabetic Foot Exam - Simple   No data filed     CMP ( most recent) CMP     Component Value Date/Time   NA 143 11/27/2021 0806   K 4.3 11/27/2021 0806   CL 105 11/27/2021 0806   CO2 22 11/27/2021 0806   GLUCOSE 96 11/27/2021 0806   GLUCOSE 92 07/09/2016 1636   BUN 8 11/27/2021 0806   CREATININE 0.74 11/27/2021 0806   CREATININE 0.66 12/26/2013 0908   CALCIUM 9.2 11/27/2021 0806   PROT 6.6 11/27/2021 0806   ALBUMIN 4.2 11/27/2021 0806   AST 25 11/27/2021 0806   ALT 21 11/27/2021 0806   ALKPHOS 62 11/27/2021 0806   BILITOT 0.2 11/27/2021 0806   GFRNONAA 112 01/21/2020 0812   GFRAA 129 01/21/2020 0812     Diabetic Labs (most recent): Lab Results  Component Value Date   HGBA1C 6.3 04/23/2022   HGBA1C 5.8 (A) 11/27/2021   HGBA1C 6.0 (H) 11/27/2021     Lipid Panel ( most recent) Lipid Panel     Component Value Date/Time   CHOL 167 11/27/2021 0806   TRIG 133 11/27/2021 0806   HDL 49 11/27/2021 0806   CHOLHDL 3.4 11/27/2021 0806   LDLCALC 94 11/27/2021 0806   LABVLDL 24 11/27/2021 0806      Lab Results  Component Value Date   TSH 2.070 01/21/2020   TSH 2.210 09/06/2018   TSH  2.370 01/03/2017   TSH 1.484 12/26/2013           Assessment & Plan:   1) Type 2 diabetes mellitus without complication, without long-term current use of insulin ([place])  She presents today  with her meter, no logs, showing stable, mostly at goal fasting glycemic profile.  Her POCT A1c today is 6.3%, increasing from last visit of 5.8%.  She notes she has been more stressed recently, moved back in with her mom.    - Bekka Eutsler has currently uncontrolled symptomatic type 2 DM since 32 years of age.   -Recent labs reviewed.  - I had a long discussion with her about the progressive nature of diabetes and the pathology behind its complications. -her diabetes is not currently complicated but she remains at a high risk for more acute and chronic complications which include CAD, CVA, CKD, retinopathy, and neuropathy. These are all discussed in detail with her.  The following Lifestyle Medicine recommendations according to [facility]) were discussed and offered to patient and she agrees to start the journey:  A. Whole Foods, Plant-based plate comprising of fruits and vegetables, plant-based proteins, whole-grain carbohydrates was discussed in detail with the patient.   A list for source of those nutrients were also provided to the patient.  Patient will use only water or unsweetened tea for hydration. B.  The need to stay away from risky substances including alcohol, smoking; obtaining 7 to 9 hours of restorative sleep, at least 150 minutes of moderate intensity exercise weekly, the importance of healthy social connections,  and stress reduction techniques were discussed. C.  A full color page of  Calorie density of various food groups per pound showing examples of each food groups was provided to the patient.  - Nutritional counseling repeated at each appointment due to patients tendency to fall back in to old habits.  - The patient admits there is a room for improvement in their diet and drink choices. -  Suggestion is made for the patient to avoid simple carbohydrates from their diet including Cakes, Sweet Desserts / Pastries, Ice Cream, Soda (diet and  regular), Sweet Tea, Candies, Chips, Cookies, Sweet Pastries, Store Bought Juices, Alcohol in Excess of 1-2 drinks a day, Artificial Sweeteners, Coffee Creamer, and "Sugar-free" Products. This will help patient to have stable blood glucose profile and potentially avoid unintended weight gain.   - I encouraged the patient to switch to unprocessed or minimally processed complex starch and increased protein intake (animal or plant source), fruits, and vegetables.   - Patient is advised to stick to a routine mealtimes to eat 3 meals a day and avoid unnecessary snacks (to snack only to correct hypoglycemia).  - she has been seen by Jearld Fenton, RDN, CDE for diabetes education is continuing to follow with her.  - I have approached her with the following individualized plan to manage her diabetes and patient agrees:   -She is advised to increase her Metformin to 1000 mg ER daily with breakfast.  -she is encouraged to continue monitoring blood glucose at least once daily, before breakfast, and to call the clinic if she has readings less than 70 or above 300 for 3 tests in a row.  - she is warned not to take insulin without proper monitoring per orders. - Adjustment parameters are given to her for hypo and hyperglycemia in writing.  - she is not an ideal candidate for GLP1 therapy due to family history of thyroid  cancer (grandfather).  She also has thyroid nodule as well.  - Specific targets for  A1c; LDL, HDL, and Triglycerides were discussed with the patient.  2) Blood Pressure /Hypertension:  her blood pressure is controlled to target.   she is advised to continue her current medications including Losartan 50 mg p.o. daily with breakfast.  3) Lipids/Hyperlipidemia:    Three is no recent lipid panel to review.  She did just have this drawn by her PCP this morning.  she is advised to continue Simvastatin 10 mg daily at bedtime.  Side effects and precautions discussed with her.  Will recheck lipid  panel prior to next visit.  4)  Weight/Diet:  her Body mass index is 41.59 kg/m.  -  clearly complicating her diabetes care.   she is a candidate for weight loss. I discussed with her the fact that loss of 5 - 10% of her  current body weight will have the most impact on her diabetes management.  Exercise, and detailed carbohydrates information provided  -  detailed on discharge instructions.  5) Chronic Care/Health Maintenance: -she is on ACEI/ARB and Statin medications and is encouraged to initiate and continue to follow up with Ophthalmology, Dentist, Podiatrist at least yearly or according to recommendations, and advised to [place]. I have recommended yearly flu vaccine and pneumonia vaccine at least every 5 years; moderate intensity exercise for up to 150 minutes weekly; and sleep for at least 7 hours a day.  - she is advised to maintain close follow up with Patient, No Pcp Per for primary care needs, as well as her other providers for optimal and coordinated care.     I spent  37  minutes in the care of the patient today including review of labs from [place], Lipids, Thyroid Function, Hematology (current and previous including abstractions from other facilities); face-to-face time discussing  her blood glucose readings/logs, discussing hypoglycemia and hyperglycemia episodes and symptoms, medications doses, her options of short and long term treatment based on the latest standards of care / guidelines;  discussion about incorporating lifestyle medicine;  and documenting the encounter. Risk reduction counseling performed per USPSTF guidelines to reduce obesity and cardiovascular risk factors.     Please refer to Patient Instructions for Blood Glucose Monitoring and Insulin/Medications Dosing Guide"  in media tab for additional information. Please  also refer to " Patient Self Inventory" in the Media  tab for reviewed elements of pertinent patient history.  Lavonda Linder participated in the  discussions, expressed understanding, and voiced agreement with the above plans.  All questions were answered to her satisfaction. she is encouraged to contact clinic should she have any questions or concerns prior to her return visit.     Follow up plan: - Return in about 3 months (around 07/22/2022) for Diabetes F/U with A1c in office, Previsit labs.    Rayetta Pigg, [facility] [address] [place], [place] [address] Phone: [phone] Fax: [phone]  04/23/2022, 8:26 AM

## 2022-04-26 ENCOUNTER — Ambulatory Visit (INDEPENDENT_AMBULATORY_CARE_PROVIDER_SITE_OTHER): Payer: 59 | Admitting: Psychiatry

## 2022-04-26 DIAGNOSIS — F4310 Post-traumatic stress disorder, unspecified: Secondary | ICD-10-CM

## 2022-04-26 DIAGNOSIS — F331 Major depressive disorder, recurrent, moderate: Secondary | ICD-10-CM
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
## 2022-04-27 ENCOUNTER — Telehealth (HOSPITAL_COMMUNITY): Payer: 59 | Admitting: Psychiatry

## 2022-04-29 ENCOUNTER — Encounter (HOSPITAL_COMMUNITY): Payer: Self-pay | Admitting: Psychiatry

## 2022-04-29 ENCOUNTER — Telehealth (INDEPENDENT_AMBULATORY_CARE_PROVIDER_SITE_OTHER): Payer: 59 | Admitting: Psychiatry

## 2022-04-29 DIAGNOSIS — E559 Vitamin D deficiency, unspecified: Secondary | ICD-10-CM

## 2022-04-29 DIAGNOSIS — F331 Major depressive disorder, recurrent, moderate: Secondary | ICD-10-CM | POA: Diagnosis not present

## 2022-04-29 DIAGNOSIS — F4310 Post-traumatic stress disorder, unspecified: Secondary | ICD-10-CM

## 2022-04-29 DIAGNOSIS — F411 Generalized anxiety disorder: Secondary | ICD-10-CM | POA: Diagnosis not present

## 2022-04-29 DIAGNOSIS — F410 Panic disorder [episodic paroxysmal anxiety] without agoraphobia: Secondary | ICD-10-CM

## 2022-04-29 DIAGNOSIS — F17200 Nicotine dependence, unspecified, uncomplicated: Secondary | ICD-10-CM

## 2022-04-29 DIAGNOSIS — F17210 Nicotine dependence, cigarettes, uncomplicated: Secondary | ICD-10-CM

## 2022-04-29 MED ORDER — BUSPIRONE HCL 7.5 MG PO TABS: 7.5000 mg | ORAL_TABLET | Freq: Two times a day (BID) | ORAL | 2 refills | Status: DC

## 2022-04-29 MED ORDER — CITALOPRAM HYDROBROMIDE 10 MG PO TABS: 10.0000 mg | ORAL_TABLET | Freq: Every evening | ORAL | 2 refills | Status: DC

## 2022-04-29 NOTE — Patient Instructions (Addendum)
We increased the Celexa to 20 mg nightly today.  This should help with the increased anxiety that you have been experiencing and should help stop the night panic attacks.  Try to get established with a PCP when you have time because we still need to check an EKG with being on the Celexa medicine.  Keep up the good work with cutting back on cigarettes and you can use this website to find a quit line number to potentially have free resources sent you: http://moreno.org/
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
## 2022-05-10 ENCOUNTER — Ambulatory Visit (HOSPITAL_COMMUNITY): Payer: 59 | Admitting: Psychiatry

## 2022-05-11 ENCOUNTER — Other Ambulatory Visit: Payer: Self-pay | Admitting: Nurse Practitioner

## 2022-05-21 ENCOUNTER — Other Ambulatory Visit: Payer: Self-pay | Admitting: Nurse Practitioner

## 2022-05-28 ENCOUNTER — Telehealth (INDEPENDENT_AMBULATORY_CARE_PROVIDER_SITE_OTHER): Payer: 59 | Admitting: Psychiatry

## 2022-05-28 ENCOUNTER — Encounter (HOSPITAL_COMMUNITY): Payer: Self-pay | Admitting: Psychiatry

## 2022-05-28 DIAGNOSIS — F410 Panic disorder [episodic paroxysmal anxiety] without agoraphobia: Secondary | ICD-10-CM

## 2022-05-28 DIAGNOSIS — F411 Generalized anxiety disorder: Secondary | ICD-10-CM

## 2022-05-28 DIAGNOSIS — E559 Vitamin D deficiency, unspecified: Secondary | ICD-10-CM

## 2022-05-28 DIAGNOSIS — F17210 Nicotine dependence, cigarettes, uncomplicated: Secondary | ICD-10-CM

## 2022-05-28 DIAGNOSIS — F331 Major depressive disorder, recurrent, moderate: Secondary | ICD-10-CM

## 2022-05-28 DIAGNOSIS — F4310 Post-traumatic stress disorder, unspecified: Secondary | ICD-10-CM | POA: Diagnosis not present

## 2022-05-28 DIAGNOSIS — F17200 Nicotine dependence, unspecified, uncomplicated: Secondary | ICD-10-CM

## 2022-05-28 MED ORDER — CITALOPRAM HYDROBROMIDE 20 MG PO TABS: 20.0000 mg | ORAL_TABLET | Freq: Every evening | ORAL | 2 refills | Status: DC

## 2022-05-28 NOTE — Patient Instructions (Signed)
We increased the Celexa to 20 mg nightly today.  If you want to use the remainder of your 10 mg tablets you can take 2 of them at night until you run out.
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
## 2022-06-01 ENCOUNTER — Telehealth (HOSPITAL_COMMUNITY): Payer: Self-pay

## 2022-06-01 DIAGNOSIS — F410 Panic disorder [episodic paroxysmal anxiety] without agoraphobia: Secondary | ICD-10-CM

## 2022-06-01 DIAGNOSIS — F331 Major depressive disorder, recurrent, moderate: Secondary | ICD-10-CM

## 2022-06-01 DIAGNOSIS — F4310 Post-traumatic stress disorder, unspecified: Secondary | ICD-10-CM

## 2022-06-01 DIAGNOSIS — F411 Generalized anxiety disorder: Secondary | ICD-10-CM

## 2022-06-01 MED ORDER — CITALOPRAM HYDROBROMIDE 10 MG PO TABS: 10.0000 mg | ORAL_TABLET | Freq: Every evening | ORAL | 2 refills | Status: DC

## 2022-06-01 NOTE — Telephone Encounter (Addendum)
Pt called in stating that last night she had a spell of crying at the kitchen table while feeling every emotion under the sun. This is the first time that this has happened since she started taking citalopram on 05/30/22 states the first day she took it she slept all day long and couldn't get out of bed which is not like her. Pt wants to know what steps to take next, is this normal with this medication or does she need to stop. Please advise she states that she was informed to f/u in 2 months.   I will call in the lower dose of citalopram again if you could give her call to let her know. Thanks

## 2022-06-02 NOTE — Telephone Encounter (Signed)
Spoke with pt advised of Dr Stinson's message pt verbalized understanding  

## 2022-06-07 ENCOUNTER — Ambulatory Visit (HOSPITAL_COMMUNITY): Payer: 59 | Admitting: Clinical

## 2022-06-08 ENCOUNTER — Ambulatory Visit (HOSPITAL_COMMUNITY): Payer: 59 | Admitting: Psychiatry

## 2022-06-22 ENCOUNTER — Ambulatory Visit (INDEPENDENT_AMBULATORY_CARE_PROVIDER_SITE_OTHER): Payer: 59 | Admitting: Psychiatry

## 2022-06-22 DIAGNOSIS — F331 Major depressive disorder, recurrent, moderate: Secondary | ICD-10-CM | POA: Diagnosis not present

## 2022-06-22 DIAGNOSIS — F4310 Post-traumatic stress disorder, unspecified: Secondary | ICD-10-CM
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
## 2022-07-06 ENCOUNTER — Ambulatory Visit (INDEPENDENT_AMBULATORY_CARE_PROVIDER_SITE_OTHER): Payer: 59 | Admitting: Psychiatry

## 2022-07-06 DIAGNOSIS — F331 Major depressive disorder, recurrent, moderate: Secondary | ICD-10-CM | POA: Diagnosis not present
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
## 2022-07-15 ENCOUNTER — Other Ambulatory Visit: Payer: Self-pay | Admitting: *Deleted

## 2022-07-15 MED ORDER — OMEPRAZOLE 40 MG PO CPDR: 40.0000 mg | DELAYED_RELEASE_CAPSULE | Freq: Every day | ORAL | 6 refills | Status: DC

## 2022-07-21 ENCOUNTER — Ambulatory Visit (INDEPENDENT_AMBULATORY_CARE_PROVIDER_SITE_OTHER): Payer: 59 | Admitting: Psychiatry

## 2022-07-21 DIAGNOSIS — F4310 Post-traumatic stress disorder, unspecified: Secondary | ICD-10-CM | POA: Diagnosis not present

## 2022-07-21 DIAGNOSIS — F331 Major depressive disorder, recurrent, moderate: Secondary | ICD-10-CM
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
## 2022-07-22 LAB — T4, FREE: Free T4: 0.95 ng/dL (ref 0.82–1.77)

## 2022-07-22 LAB — COMPREHENSIVE METABOLIC PANEL
ALT: 22 IU/L (ref 0–32)
AST: 15 IU/L (ref 0–40)
Albumin/Globulin Ratio: 1.7 (ref 1.2–2.2)
Albumin: 4.1 g/dL (ref 3.9–4.9)
Alkaline Phosphatase: 55 IU/L (ref 44–121)
BUN/Creatinine Ratio: 20 (ref 9–23)
BUN: 15 mg/dL (ref 6–20)
Bilirubin Total: 0.2 mg/dL (ref 0.0–1.2)
CO2: 19 mmol/L — ABNORMAL LOW (ref 20–29)
Calcium: 9.3 mg/dL (ref 8.7–10.2)
Chloride: 104 mmol/L (ref 96–106)
Creatinine, Ser: 0.74 mg/dL (ref 0.57–1.00)
Globulin, Total: 2.4 g/dL (ref 1.5–4.5)
Glucose: 131 mg/dL — ABNORMAL HIGH (ref 70–99)
Potassium: 4.3 mmol/L (ref 3.5–5.2)
Sodium: 138 mmol/L (ref 134–144)
Total Protein: 6.5 g/dL (ref 6.0–8.5)
eGFR: 109 mL/min/{1.73_m2} (ref >59)

## 2022-07-22 LAB — LIPID PANEL
Chol/HDL Ratio: 3.9 ratio (ref 0.0–4.4)
Cholesterol, Total: 160 mg/dL (ref 100–199)
HDL: 41 mg/dL (ref >39)
LDL Chol Calc (NIH): 99 mg/dL (ref 0–99)
Triglycerides: 108 mg/dL (ref 0–149)
VLDL Cholesterol Cal: 20 mg/dL (ref 5–40)

## 2022-07-22 LAB — TSH: TSH: 1.57 u[IU]/mL (ref 0.450–4.500)

## 2022-07-22 LAB — VITAMIN D 25 HYDROXY (VIT D DEFICIENCY, FRACTURES): Vit D, 25-Hydroxy: 44.9 ng/mL (ref 30.0–100.0)

## 2022-07-23 ENCOUNTER — Encounter: Payer: Self-pay | Admitting: *Deleted

## 2022-07-27 ENCOUNTER — Telehealth: Payer: Self-pay | Admitting: *Deleted

## 2022-07-27 NOTE — Telephone Encounter (Signed)
Pt's Omeprazole 40 mg has been approved through 07/27/23. Pt and [employer] in [place] aware. JSY

## 2022-07-28 ENCOUNTER — Ambulatory Visit: Payer: 59 | Admitting: Nurse Practitioner

## 2022-07-30 ENCOUNTER — Ambulatory Visit: Payer: 59 | Admitting: Nurse Practitioner

## 2022-07-30 ENCOUNTER — Encounter: Payer: Self-pay | Admitting: Nurse Practitioner

## 2022-07-30 VITALS — BP 121/86 | HR 80 | Ht 62.25 in | Wt 238.2 lb

## 2022-07-30 DIAGNOSIS — Z7984 Long term (current) use of oral hypoglycemic drugs: Secondary | ICD-10-CM

## 2022-07-30 DIAGNOSIS — E119 Type 2 diabetes mellitus without complications: Secondary | ICD-10-CM

## 2022-07-30 LAB — POCT GLYCOSYLATED HEMOGLOBIN (HGB A1C): Hemoglobin A1C: 6.4 % — AB (ref 4.0–5.6)

## 2022-07-30 MED ORDER — METFORMIN HCL ER 500 MG PO TB24: 1000.0000 mg | ORAL_TABLET | Freq: Every day | ORAL | 3 refills | Status: DC

## 2022-07-30 NOTE — Progress Notes (Signed)
Endocrinology Follow Up Note       07/30/2022, 8:25 AM   Subjective:    Patient ID: Rachel Higgins, female    DOB: [birth year].  Rachel Higgins is being seen in follow up after being seen in consultation for management of currently uncontrolled symptomatic diabetes requested by  Patient, No Pcp Per.   Past Medical History:  Diagnosis Date   Anxiety and depression 09/06/2019   Broken arm 1996   Contraceptive management 09/26/2012   Contraceptive management 09/26/2012   Encounter for well woman exam with routine gynecological exam 09/06/2019   Hematuria 07/03/2013   Hypertension 09/06/2019   Late period 05/18/2018   LLQ pain 05/18/2018   Migraine headache    Painful urination 07/03/2013   Painful urination 07/03/2013   Patient desires pregnancy 01/15/2020   Postcoital bleeding 12/30/2014   Postcoital bleeding 12/30/2014   Right ovarian cyst 07/13/2013   RUQ pain 07/20/2013   RUQ pain 07/20/2013   Has adenomyomatosis of GB refer to Dr Lovell Sheehan appt 5/28 at 10 am   Thyroid nodule    Tired 12/26/2013   Tired 12/26/2013   Type 2 diabetes mellitus without complication, without long-term current use of insulin (HCC) 04/10/2021   Unspecified symptom associated with female genital organs 07/03/2013    Past Surgical History:  Procedure Laterality Date   CHOLECYSTECTOMY N/A 09/03/2013   Procedure: LAPAROSCOPIC CHOLECYSTECTOMY;  Surgeon: Dalia Heading, MD;  Location: [facility];  Service: General;  Laterality: N/A;   WISDOM TOOTH EXTRACTION      Social History   Socioeconomic History   Marital status: Single    Spouse name: Not on file   Number of children: Not on file   Years of education: Not on file   Highest education level: Not on file  Occupational History   Not on file  Tobacco Use   Smoking status: Every Day    Packs/day: 0.50    Years: 20.00    Additional pack years: 0.00    Total pack years: 10.00    Types: Cigarettes,  E-cigarettes   Smokeless tobacco: Never   Tobacco comments:    form given 10-22-15. Also vapes at work.  Down to 1-2 cigarettes daily 05/28/2022 but still vaping  Vaping Use   Vaping Use: Every day  Substance and Sexual Activity   Alcohol use: No   Drug use: Not Currently   Sexual activity: Not Currently    Birth control/protection: Pill  Other Topics Concern   Not on file  Social History Narrative   Not on file   Social Determinants of Health   Financial Resource Strain: Medium Risk (10/15/2021)   Overall Financial Resource Strain (CARDIA)    Difficulty of Paying Living Expenses: Somewhat hard  Food Insecurity: No Food Insecurity (10/15/2021)   Hunger Vital Sign    Worried About Running Out of Food in the Last Year: Never true    Ran Out of Food in the Last Year: Never true  Transportation Needs: No Transportation Needs (10/15/2021)   PRAPARE - Administrator, Civil Service (Medical): No    Lack of Transportation (Non-Medical): No  Physical Activity: Sufficiently Active (10/15/2021)   Exercise Vital  Sign    Days of Exercise per Week: 6 days    Minutes of Exercise per Session: 40 min  Stress: Stress Concern Present (10/15/2021)   [employer] of Occupational Health - Occupational Stress Questionnaire    Feeling of Stress : To some extent  Social Connections: Socially Isolated (10/15/2021)   Social Connection and Isolation Panel [NHANES]    Frequency of Communication with Friends and Family: Once a week    Frequency of Social Gatherings with Friends and Family: Never    Attends Religious Services: Never    Diplomatic Services operational officer: No    Attends Engineer, structural: Never    Marital Status: Separated    Family History  Problem Relation Age of Onset   Vision loss Mother    Thyroid disease Maternal Aunt    Thyroid cancer Maternal Grandfather    Stroke Paternal Grandmother    Arthritis Paternal Grandmother    COPD Paternal Grandmother     Heart disease Paternal Grandmother    Breast cancer Paternal Grandmother    Lung cancer Paternal Grandmother    Diabetes Paternal Grandfather    Hyperlipidemia Father    Arthritis Father    Prostate cancer Father    Esophageal cancer Neg Hx    Pancreatic cancer Neg Hx    Stomach cancer Neg Hx    Colon cancer Neg Hx    Liver disease Neg Hx     Outpatient Encounter Medications as of 07/30/2022  Medication Sig   ACCU-CHEK GUIDE test strip USE 1 STRIP TO CHECK GLUCOSE 4 TIMES DAILY   Accu-Chek Softclix Lancets lancets USE UP TO 4 TIMES DAILY AS DIRECTED   blood glucose meter kit and supplies KIT Dispense based on patient and insurance preference. Use up to four times daily as directed.   Blood Glucose Monitoring Suppl (ACCU-CHEK GUIDE) w/Device KIT Use to monitor glucose once daily   busPIRone (BUSPAR) 7.5 MG tablet Take 1 tablet (7.5 mg total) by mouth 2 (two) times daily.   Cholecalciferol 125 MCG (5000 UT) capsule Take 1 capsule (5,000 Units total) by mouth daily.   citalopram (CELEXA) 10 MG tablet Take 1 tablet (10 mg total) by mouth at bedtime.   losartan (COZAAR) 50 MG tablet TAKE 1 TABLET BY MOUTH ONCE DAILY - TIME FOR LABS   norethindrone (MICRONOR) 0.35 MG tablet Take 1 tablet (0.35 mg total) by mouth daily.   omeprazole (PRILOSEC) 40 MG capsule Take 1 capsule (40 mg total) by mouth daily.   simvastatin (ZOCOR) 10 MG tablet Take 1 tablet by mouth once daily   vitamin B-12 (CYANOCOBALAMIN) 500 MCG tablet Take 500 mcg by mouth daily.   [DISCONTINUED] metFORMIN (GLUCOPHAGE-XR) 500 MG 24 hr tablet Take 2 tablets (1,000 mg total) by mouth daily with breakfast.   metFORMIN (GLUCOPHAGE-XR) 500 MG 24 hr tablet Take 2 tablets (1,000 mg total) by mouth daily with breakfast.   No facility-administered encounter medications on file as of 07/30/2022.    ALLERGIES: Allergies  Allergen Reactions   Adhesive [Tape]     Rash and burns skin     VACCINATION STATUS:  There is no  immunization history on file for this patient.  Diabetes She presents for her follow-up diabetic visit. She has type 2 diabetes mellitus. Her disease course has been worsening. There are no hypoglycemic associated symptoms. There are no diabetic associated symptoms. There are no hypoglycemic complications. Symptoms are stable. There are no diabetic complications. Risk factors for coronary artery  disease include diabetes mellitus, dyslipidemia, family history, obesity, hypertension and tobacco exposure. Current diabetic treatment includes oral agent (monotherapy). She is compliant with treatment most of the time. Her weight is increasing steadily. She is following a generally healthy diet. When asked about meal planning, she reported none. She has had a previous visit with a dietitian. She participates in exercise daily. Her overall blood glucose range is 130-140 mg/dl. (She presents today with her meter showing inconsistent glucose monitoring (says she recently changed the battery and it wiped all her recent readings).  Her POCT A1c today is 6.4%, increasing slightly from last visit of 6.3%.  Her morning readings are averaging in the 130s.  She admits she has not been doing well with her diet or exercise regimen, but does have plans to get back on track.) An ACE inhibitor/angiotensin II receptor blocker is being taken. She does not see a podiatrist.Eye exam is current.    Review of systems  Constitutional: + steadily increasing body weight,  current Body mass index is 43.22 kg/m. , no fatigue, no subjective hyperthermia, no subjective hypothermia Eyes: no blurry vision, no xerophthalmia ENT: no sore throat, no nodules palpated in throat, no dysphagia/odynophagia, no hoarseness Cardiovascular: no chest pain, no shortness of breath, no palpitations, no leg swelling Respiratory: no cough, no shortness of breath Gastrointestinal: no nausea/vomiting/diarrhea Musculoskeletal: no muscle/joint aches Skin: no  rashes, no hyperemia Neurological: no tremors, no numbness, no tingling, no dizziness Psychiatric: no depression, no anxiety  Objective:     BP 121/86 (BP Location: Left Arm, Patient Position: Sitting, Cuff Size: Large)   Pulse 80   Ht 5' 2.25" (1.581 m)   Wt 238 lb 3.2 oz (108 kg)   BMI 43.22 kg/m   Wt Readings from Last 3 Encounters:  07/30/22 238 lb 3.2 oz (108 kg)  04/23/22 229 lb 3.2 oz (104 kg)  01/07/22 217 lb (98.4 kg)     BP Readings from Last 3 Encounters:  07/30/22 121/86  04/23/22 122/82  01/07/22 121/85       Physical Exam- Limited  Constitutional:  Body mass index is 43.22 kg/m. , not in acute distress, normal state of mind Eyes:  EOMI, no exophthalmos Musculoskeletal: no gross deformities, strength intact in all four extremities, no gross restriction of joint movements Skin:  no rashes, no hyperemia Neurological: no tremor with outstretched hands   Diabetic Foot Exam - Simple   No data filed     CMP ( most recent) CMP     Component Value Date/Time   NA 138 07/21/2022 0819   K 4.3 07/21/2022 0819   CL 104 07/21/2022 0819   CO2 19 (L) 07/21/2022 0819   GLUCOSE 131 (H) 07/21/2022 0819   GLUCOSE 92 07/09/2016 1636   BUN 15 07/21/2022 0819   CREATININE 0.74 07/21/2022 0819   CREATININE 0.66 12/26/2013 0908   CALCIUM 9.3 07/21/2022 0819   PROT 6.5 07/21/2022 0819   ALBUMIN 4.1 07/21/2022 0819   AST 15 07/21/2022 0819   ALT 22 07/21/2022 0819   ALKPHOS 55 07/21/2022 0819   BILITOT 0.2 07/21/2022 0819   GFRNONAA 112 01/21/2020 0812   GFRAA 129 01/21/2020 0812     Diabetic Labs (most recent): Lab Results  Component Value Date   HGBA1C 6.4 (A) 07/30/2022   HGBA1C 6.3 04/23/2022   HGBA1C 5.8 (A) 11/27/2021     Lipid Panel ( most recent) Lipid Panel     Component Value Date/Time   CHOL 160 07/21/2022 0819  TRIG 108 07/21/2022 0819   HDL 41 07/21/2022 0819   CHOLHDL 3.9 07/21/2022 0819   LDLCALC 99 07/21/2022 0819   LABVLDL 20  07/21/2022 0819      Lab Results  Component Value Date   TSH 1.570 07/21/2022   TSH 2.070 01/21/2020   TSH 2.210 09/06/2018   TSH 2.370 01/03/2017   TSH 1.484 12/26/2013   FREET4 0.95 07/21/2022           Assessment & Plan:   1) Type 2 diabetes mellitus without complication, without long-term current use of insulin (HCC)  She presents today with her meter showing inconsistent glucose monitoring (says she recently changed the battery and it wiped all her recent readings).  Her POCT A1c today is 6.4%, increasing slightly from last visit of 6.3%.  Her morning readings are averaging in the 130s.  She admits she has not been doing well with her diet or exercise regimen, but does have plans to get back on track.  - Rachel Higgins has currently uncontrolled symptomatic type 2 DM since 32 years of age.   -Recent labs reviewed.  - I had a long discussion with her about the progressive nature of diabetes and the pathology behind its complications. -her diabetes is not currently complicated but she remains at a high risk for more acute and chronic complications which include CAD, CVA, CKD, retinopathy, and neuropathy. These are all discussed in detail with her.  The following Lifestyle Medicine recommendations according to American College of Lifestyle Medicine [facility]) were discussed and offered to patient and she agrees to start the journey:  A. Whole Foods, Plant-based plate comprising of fruits and vegetables, plant-based proteins, whole-grain carbohydrates was discussed in detail with the patient.   A list for source of those nutrients were also provided to the patient.  Patient will use only water or unsweetened tea for hydration. B.  The need to stay away from risky substances including alcohol, smoking; obtaining 7 to 9 hours of restorative sleep, at least 150 minutes of moderate intensity exercise weekly, the importance of healthy social connections,  and stress reduction techniques were  discussed. C.  A full color page of  Calorie density of various food groups per pound showing examples of each food groups was provided to the patient.  - Nutritional counseling repeated at each appointment due to patients tendency to fall back in to old habits.  - The patient admits there is a room for improvement in their diet and drink choices. -  Suggestion is made for the patient to avoid simple carbohydrates from their diet including Cakes, Sweet Desserts / Pastries, Ice Cream, Soda (diet and regular), Sweet Tea, Candies, Chips, Cookies, Sweet Pastries, Store Bought Juices, Alcohol in Excess of 1-2 drinks a day, Artificial Sweeteners, Coffee Creamer, and "Sugar-free" Products. This will help patient to have stable blood glucose profile and potentially avoid unintended weight gain.   - I encouraged the patient to switch to unprocessed or minimally processed complex starch and increased protein intake (animal or plant source), fruits, and vegetables.   - Patient is advised to stick to a routine mealtimes to eat 3 meals a day and avoid unnecessary snacks (to snack only to correct hypoglycemia).  - she has been seen by Norm Salt, RDN, CDE for diabetes education is continuing to follow with her.  - I have approached her with the following individualized plan to manage her diabetes and patient agrees:   -She is advised to increase her Metformin to  1000 mg ER daily with breakfast.  -she is encouraged to continue monitoring blood glucose at least once daily, before breakfast, and to call the clinic if she has readings less than 70 or above 300 for 3 tests in a row.  I encouraged her to restart using food journal to help with accountability of her food intake.  - she is warned not to take insulin without proper monitoring per orders. - Adjustment parameters are given to her for hypo and hyperglycemia in writing.  - she is not an ideal candidate for GLP1 therapy due to family history of  thyroid cancer (grandfather).  She also has thyroid nodule as well.  - Specific targets for  A1c; LDL, HDL, and Triglycerides were discussed with the patient.  2) Blood Pressure /Hypertension:  her blood pressure is controlled to target.   she is advised to continue her current medications including Losartan 50 mg p.o. daily with breakfast.  3) Lipids/Hyperlipidemia:    Her recent lipid panel from 07/21/22 shows controlled LDL of 99.  she is advised to continue Simvastatin 10 mg daily at bedtime.  Side effects and precautions discussed with her.    4)  Weight/Diet:  her Body mass index is 43.22 kg/m.  -  clearly complicating her diabetes care.   she is a candidate for weight loss. I discussed with her the fact that loss of 5 - 10% of her  current body weight will have the most impact on her diabetes management.  Exercise, and detailed carbohydrates information provided  -  detailed on discharge instructions.  5) Chronic Care/Health Maintenance: -she is on ACEI/ARB and Statin medications and is encouraged to initiate and continue to follow up with Ophthalmology, Dentist, Podiatrist at least yearly or according to recommendations, and advised to QUIT SMOKING. I have recommended yearly flu vaccine and pneumonia vaccine at least every 5 years; moderate intensity exercise for up to 150 minutes weekly; and sleep for at least 7 hours a day.  - she is advised to maintain close follow up with Patient, No Pcp Per for primary care needs, as well as her other providers for optimal and coordinated care.     I spent  34  minutes in the care of the patient today including review of labs from CMP, Lipids, Thyroid Function, Hematology (current and previous including abstractions from other facilities); face-to-face time discussing  her blood glucose readings/logs, discussing hypoglycemia and hyperglycemia episodes and symptoms, medications doses, her options of short and long term treatment based on the latest  standards of care / guidelines;  discussion about incorporating lifestyle medicine;  and documenting the encounter. Risk reduction counseling performed per USPSTF guidelines to reduce obesity and cardiovascular risk factors.     Please refer to Patient Instructions for Blood Glucose Monitoring and Insulin/Medications Dosing Guide"  in media tab for additional information. Please  also refer to " Patient Self Inventory" in the Media  tab for reviewed elements of pertinent patient history.  Rachel Higgins participated in the discussions, expressed understanding, and voiced agreement with the above plans.  All questions were answered to her satisfaction. she is encouraged to contact clinic should she have any questions or concerns prior to her return visit.     Follow up plan: - Return in about 4 months (around 11/29/2022) for Diabetes F/U with A1c in office, No previsit labs, Bring meter and logs.    Ronny Bacon, [facility] [address] [place], [place] [address] Phone: [phone] Fax:  [phone]  07/30/2022, 8:25 AM

## 2022-07-30 NOTE — Patient Instructions (Signed)

## 2022-08-12 ENCOUNTER — Ambulatory Visit (HOSPITAL_COMMUNITY): Payer: 59 | Admitting: Psychiatry

## 2022-08-28 ENCOUNTER — Other Ambulatory Visit: Payer: Self-pay | Admitting: Adult Health

## 2022-09-15 ENCOUNTER — Telehealth (HOSPITAL_COMMUNITY): Payer: Self-pay | Admitting: Psychiatry

## 2022-09-15 ENCOUNTER — Ambulatory Visit (HOSPITAL_COMMUNITY): Payer: 59 | Admitting: Psychiatry

## 2022-09-15 NOTE — Telephone Encounter (Signed)
Therapist called patient regarding scheduled in office appointment.  Patient indicated she forgotten about appointment, currently is at work,and is unable to do a video visit.  Patient is reminded of no-show policy and given information regarding dates and times of upcoming appointments.

## 2022-09-29 ENCOUNTER — Ambulatory Visit (INDEPENDENT_AMBULATORY_CARE_PROVIDER_SITE_OTHER): Payer: 59 | Admitting: Psychiatry

## 2022-09-29 DIAGNOSIS — F4310 Post-traumatic stress disorder, unspecified: Secondary | ICD-10-CM

## 2022-09-29 DIAGNOSIS — F331 Major depressive disorder, recurrent, moderate: Secondary | ICD-10-CM | POA: Diagnosis not present
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
## 2022-10-12 ENCOUNTER — Other Ambulatory Visit (HOSPITAL_COMMUNITY): Payer: Self-pay | Admitting: Psychiatry

## 2022-10-12 DIAGNOSIS — F4310 Post-traumatic stress disorder, unspecified: Secondary | ICD-10-CM

## 2022-10-12 DIAGNOSIS — F331 Major depressive disorder, recurrent, moderate: Secondary | ICD-10-CM

## 2022-10-12 DIAGNOSIS — F410 Panic disorder [episodic paroxysmal anxiety] without agoraphobia: Secondary | ICD-10-CM

## 2022-10-13 ENCOUNTER — Ambulatory Visit (HOSPITAL_COMMUNITY): Payer: 59 | Admitting: Psychiatry

## 2022-10-13 DIAGNOSIS — F331 Major depressive disorder, recurrent, moderate: Secondary | ICD-10-CM | POA: Diagnosis not present

## 2022-10-13 DIAGNOSIS — F4310 Post-traumatic stress disorder, unspecified: Secondary | ICD-10-CM | POA: Diagnosis not present
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
## 2022-10-20 ENCOUNTER — Other Ambulatory Visit: Payer: Self-pay | Admitting: Adult Health

## 2022-11-06 ENCOUNTER — Other Ambulatory Visit: Payer: Self-pay | Admitting: Adult Health

## 2022-11-22 ENCOUNTER — Ambulatory Visit (INDEPENDENT_AMBULATORY_CARE_PROVIDER_SITE_OTHER): Payer: 59 | Admitting: Psychiatry

## 2022-11-22 DIAGNOSIS — F331 Major depressive disorder, recurrent, moderate: Secondary | ICD-10-CM

## 2022-11-22 DIAGNOSIS — F4310 Post-traumatic stress disorder, unspecified: Secondary | ICD-10-CM
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
## 2022-11-25 ENCOUNTER — Ambulatory Visit
Admission: EM | Admit: 2022-11-25 | Discharge: 2022-11-25 | Disposition: A | Discharge status: Home / Self Care | Payer: 59 | Attending: Family Medicine | Admitting: Family Medicine

## 2022-11-25 ENCOUNTER — Other Ambulatory Visit: Payer: Self-pay

## 2022-11-25 ENCOUNTER — Encounter: Payer: Self-pay | Admitting: Emergency Medicine

## 2022-11-25 DIAGNOSIS — H66003 Acute suppurative otitis media without spontaneous rupture of ear drum, bilateral: Secondary | ICD-10-CM

## 2022-11-25 MED ORDER — AMOXICILLIN 875 MG PO TABS: 875.0000 mg | ORAL_TABLET | Freq: Two times a day (BID) | ORAL | 0 refills | Status: DC

## 2022-11-25 NOTE — ED Provider Notes (Signed)
[facility]    CSN: [id number] Arrival date & time: 11/25/22  1833      History   Chief Complaint Chief Complaint  Patient presents with   Ear Pain    HPI Rachel Higgins is a 32 y.o. female.   Patient presenting today with sharp stabbing constant pain to ears bilaterally for the past few days.  States she has been dealing with a head cold that seems to have made ongoing issues with her ears much worse.  Denies drainage, fevers, chills, headache, nausea, vomiting.  Taking over-the-counter cold and congestion medication with minimal relief.    Past Medical History:  Diagnosis Date   Anxiety and depression 09/06/2019   Broken arm 1996   Contraceptive management 09/26/2012   Contraceptive management 09/26/2012   Encounter for well woman exam with routine gynecological exam 09/06/2019   Hematuria 07/03/2013   Hypertension 09/06/2019   Late period 05/18/2018   LLQ pain 05/18/2018   Migraine headache    Painful urination 07/03/2013   Painful urination 07/03/2013   Patient desires pregnancy 01/15/2020   Postcoital bleeding 12/30/2014   Postcoital bleeding 12/30/2014   Right ovarian cyst 07/13/2013   RUQ pain 07/20/2013   RUQ pain 07/20/2013   Has adenomyomatosis of GB refer to Dr Lovell Sheehan appt 5/28 at 10 am   Thyroid nodule    Tired 12/26/2013   Tired 12/26/2013   Type 2 diabetes mellitus without complication, without long-term current use of insulin (HCC) 04/10/2021   Unspecified symptom associated with female genital organs 07/03/2013    Patient Active Problem List   Diagnosis Date Noted   PTSD (post-traumatic stress disorder) 11/03/2021   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   Generalized anxiety disorder with panic attacks 11/03/2021   Screening examination for STD (sexually transmitted disease) 07/15/2021   Type 2 diabetes mellitus without complication, without long-term current use of insulin (HCC) 04/10/2021   Tobacco use disorder 04/10/2021   Irregular  periods 04/10/2021   Breast pain, left 10/08/2020   Tension headache 11/15/2019   Palpitations 10/18/2019   Hypertension 09/06/2019   Vitamin D deficiency 09/07/2018   Encounter for gynecological examination with Papanicolaou smear of cervix 08/30/2018   Elevated cholesterol 08/30/2018   Encounter for vitamin deficiency screening 08/30/2018   Elevated hemoglobin A1c 08/30/2018   Heartburn 08/30/2018   Elevated BP without diagnosis of hypertension 05/18/2018   Right ovarian cyst 07/13/2013    Past Surgical History:  Procedure Laterality Date   CHOLECYSTECTOMY N/A 09/03/2013   Procedure: LAPAROSCOPIC CHOLECYSTECTOMY;  Surgeon: Dalia Heading, MD;  Location: [facility];  Service: General;  Laterality: N/A;   WISDOM TOOTH EXTRACTION      OB History     Gravida  0   Para  0   Term  0   Preterm  0   AB  0   Living  0      SAB  0   IAB  0   Ectopic  0   Multiple  0   Live Births  0            Home Medications    Prior to Admission medications   Medication Sig Start Date End Date Taking? Authorizing Provider  amoxicillin (AMOXIL) 875 MG tablet Take 1 tablet (875 mg total) by mouth 2 (two) times daily. 11/25/22  Yes Particia Nearing, PA-C  ACCU-CHEK GUIDE test strip USE 1 STRIP TO CHECK GLUCOSE 4 TIMES DAILY 03/06/21   Adline Potter, NP  Accu-Chek Softclix Lancets lancets USE UP TO 4 TIMES DAILY AS DIRECTED 01/29/21   Adline Potter, NP  blood glucose meter kit and supplies KIT Dispense based on patient and insurance preference. Use up to four times daily as directed. 01/06/21   Adline Potter, NP  Blood Glucose Monitoring Suppl (ACCU-CHEK GUIDE) w/Device KIT Use to monitor glucose once daily 03/18/22   Dani Gobble, NP  busPIRone (BUSPAR) 7.5 MG tablet Take 1 tablet (7.5 mg total) by mouth 2 (two) times daily. 04/29/22   Elsie Lincoln, MD  Cholecalciferol 125 MCG (5000 UT) capsule Take 1 capsule (5,000 Units total) by mouth daily. 09/07/18    Adline Potter, NP  citalopram (CELEXA) 10 MG tablet Take 1 tablet (10 mg total) by mouth at bedtime. 06/01/22 08/30/22  Elsie Lincoln, MD  losartan (COZAAR) 50 MG tablet TAKE 1 TABLET BY MOUTH ONCE DAILY - TIME FOR LABS 11/08/22   Adline Potter, NP  metFORMIN (GLUCOPHAGE-XR) 500 MG 24 hr tablet Take 2 tablets (1,000 mg total) by mouth daily with breakfast. 07/30/22   Dani Gobble, NP  norethindrone (MICRONOR) 0.35 MG tablet Take 1 tablet by mouth once daily 10/20/22   Adline Potter, NP  omeprazole (PRILOSEC) 40 MG capsule Take 1 capsule (40 mg total) by mouth daily. 07/15/22   Adline Potter, NP  simvastatin (ZOCOR) 10 MG tablet Take 1 tablet by mouth once daily 08/30/22   Adline Potter, NP  vitamin B-12 (CYANOCOBALAMIN) 500 MCG tablet Take 500 mcg by mouth daily.    [provider]    Family History Family History  Problem Relation Age of Onset   Vision loss Mother    Thyroid disease Maternal Aunt    Thyroid cancer Maternal Grandfather    Stroke Paternal Grandmother    Arthritis Paternal Grandmother    COPD Paternal Grandmother    Heart disease Paternal Grandmother    Breast cancer Paternal Grandmother    Lung cancer Paternal Grandmother    Diabetes Paternal Grandfather    Hyperlipidemia Father    Arthritis Father    Prostate cancer Father    Esophageal cancer Neg Hx    Pancreatic cancer Neg Hx    Stomach cancer Neg Hx    Colon cancer Neg Hx    Liver disease Neg Hx     Social History Social History   Tobacco Use   Smoking status: Every Day    Current packs/day: 0.50    Average packs/day: 0.5 packs/day for 20.0 years (10.0 ttl pk-yrs)    Types: Cigarettes, E-cigarettes   Smokeless tobacco: Never   Tobacco comments:    form given 10-22-15. Also vapes at work.  Down to 1-2 cigarettes daily 05/28/2022 but still vaping  Vaping Use   Vaping status: Every Day  Substance Use Topics   Alcohol use: No   Drug use: Not Currently      Allergies   Adhesive [tape]   Review of Systems Review of Systems PER HPI  Physical Exam Triage Vital Signs ED Triage Vitals  Encounter Vitals Group     BP 11/25/22 1853 (!) 162/91     Systolic BP Percentile --      Diastolic BP Percentile --      Pulse Rate 11/25/22 1853 86     Resp 11/25/22 1853 20     Temp 11/25/22 1853 98.5 F (36.9 C)     Temp Source 11/25/22 1853 Oral     SpO2  11/25/22 1853 97 %     Weight --      Height --      Head Circumference --      Peak Flow --      Pain Score 11/25/22 1851 8     Pain Loc --      Pain Education --      Exclude from Growth Chart --    No data found.  Updated Vital Signs BP (!) 162/91 (BP Location: Right Arm)   Pulse 86   Temp 98.5 F (36.9 C) (Oral)   Resp 20   LMP 11/07/2022 (Approximate)   SpO2 97%   Visual Acuity Right Eye Distance:   Left Eye Distance:   Bilateral Distance:    Right Eye Near:   Left Eye Near:    Bilateral Near:     Physical Exam Vitals and nursing note reviewed.  Constitutional:      Appearance: Normal appearance. She is not ill-appearing.  HENT:     Head: Atraumatic.     Ears:     Comments: Bilateral TMs erythematous, edematous worse on the left    Nose: Nose normal.     Mouth/Throat:     Mouth: Mucous membranes are moist.     Pharynx: Oropharynx is clear.  Eyes:     Extraocular Movements: Extraocular movements intact.     Conjunctiva/sclera: Conjunctivae normal.  Cardiovascular:     Rate and Rhythm: Normal rate and regular rhythm.     Heart sounds: Normal heart sounds.  Pulmonary:     Effort: Pulmonary effort is normal.     Breath sounds: Normal breath sounds.  Musculoskeletal:        General: Normal range of motion.     Cervical back: Normal range of motion and neck supple.  Skin:    General: Skin is warm and dry.  Neurological:     Mental Status: She is alert and oriented to person, place, and time.  Psychiatric:        Mood and Affect: Mood normal.         Thought Content: Thought content normal.        Judgment: Judgment normal.      UC Treatments / Results  Labs (all labs ordered are listed, but only abnormal results are displayed) Labs Reviewed - No data to display  EKG   Radiology No results found.  Procedures Procedures (including critical care time)  Medications Ordered in UC Medications - No data to display  Initial Impression / Assessment and Plan / UC Course  I have reviewed the triage vital signs and the nursing notes.  Pertinent labs & imaging results that were available during my care of the patient were reviewed by me and considered in my medical decision making (see chart for details).     Treat with Amoxil, over-the-counter pain relievers, cold and congestion medications.  Return for worsening symptoms.  Final Clinical Impressions(s) / UC Diagnoses   Final diagnoses:  Acute suppurative otitis media of both ears without spontaneous rupture of tympanic membranes, recurrence not specified   Discharge Instructions   None    ED Prescriptions     Medication Sig Dispense Auth. Provider   amoxicillin (AMOXIL) 875 MG tablet Take 1 tablet (875 mg total) by mouth 2 (two) times daily. 20 tablet Particia Nearing, [place]      PDMP not reviewed this encounter.   Particia Nearing, [place] 11/25/22 1936

## 2022-11-25 NOTE — ED Triage Notes (Signed)
Pt reports drainage/fluid in ears intermittently since may. Pt reports last few days reports developed stabbing pain in bilateral ears. Denies any known trauma/injury. Denies any known fevers but reports has been taking otc medication for recent "head cold". Home covid test negative.

## 2022-11-29 ENCOUNTER — Other Ambulatory Visit: Payer: Self-pay | Admitting: *Deleted

## 2022-11-29 MED ORDER — SIMVASTATIN 10 MG PO TABS: 10.0000 mg | ORAL_TABLET | Freq: Every day | ORAL | 6 refills | Status: DC

## 2022-11-30 ENCOUNTER — Ambulatory Visit: Payer: 59 | Admitting: Nurse Practitioner

## 2022-11-30 DIAGNOSIS — I10 Essential (primary) hypertension: Secondary | ICD-10-CM

## 2022-11-30 DIAGNOSIS — Z7984 Long term (current) use of oral hypoglycemic drugs: Secondary | ICD-10-CM

## 2022-11-30 DIAGNOSIS — E782 Mixed hyperlipidemia: Secondary | ICD-10-CM

## 2022-11-30 DIAGNOSIS — E119 Type 2 diabetes mellitus without complications: Secondary | ICD-10-CM

## 2022-12-03 ENCOUNTER — Telehealth (HOSPITAL_COMMUNITY): Payer: Self-pay | Admitting: *Deleted

## 2022-12-03 ENCOUNTER — Other Ambulatory Visit (HOSPITAL_COMMUNITY): Payer: Self-pay | Admitting: Psychiatry

## 2022-12-03 DIAGNOSIS — F410 Panic disorder [episodic paroxysmal anxiety] without agoraphobia: Secondary | ICD-10-CM

## 2022-12-03 NOTE — Telephone Encounter (Signed)
Per provider to call patient to schedule an appt for medication refills. Staff called and was not able to reach patient and [facility] for patient to call office to schedule an appt for more medication refills. Office number was provided on voicemail.

## 2022-12-06 ENCOUNTER — Ambulatory Visit (INDEPENDENT_AMBULATORY_CARE_PROVIDER_SITE_OTHER): Payer: 59 | Admitting: Psychiatry

## 2022-12-06 DIAGNOSIS — F4310 Post-traumatic stress disorder, unspecified: Secondary | ICD-10-CM | POA: Diagnosis not present

## 2022-12-06 DIAGNOSIS — F331 Major depressive disorder, recurrent, moderate: Secondary | ICD-10-CM
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
## 2022-12-12 ENCOUNTER — Encounter: Payer: Self-pay | Admitting: Emergency Medicine

## 2022-12-12 ENCOUNTER — Ambulatory Visit
Admission: EM | Admit: 2022-12-12 | Discharge: 2022-12-12 | Disposition: A | Discharge status: Home / Self Care | Payer: 59 | Attending: Family Medicine | Admitting: Family Medicine

## 2022-12-12 DIAGNOSIS — H66003 Acute suppurative otitis media without spontaneous rupture of ear drum, bilateral: Secondary | ICD-10-CM

## 2022-12-12 MED ORDER — CIPROFLOXACIN-DEXAMETHASONE 0.3-0.1 % OT SUSP: 4.0000 [drp] | Freq: Two times a day (BID) | OTIC | 0 refills | Status: DC

## 2022-12-12 MED ORDER — AZITHROMYCIN 250 MG PO TABS: ORAL_TABLET | ORAL | 0 refills | Status: DC

## 2022-12-12 NOTE — ED Provider Notes (Signed)
[facility]    CSN: [id number] Arrival date & time: 12/12/22  1330      History   Chief Complaint No chief complaint on file.   HPI Rachel Higgins is a 32 y.o. female.   Presenting today following up on bilateral ear infection from 2 weeks ago.  Was given amoxicillin at this visit, states she got a bit better during the antibiotics but as soon as she stopped and the symptoms returned.  Denies fever, chills, chest pain, shortness of breath, abdominal pain, nausea vomiting or diarrhea.    Past Medical History:  Diagnosis Date   Anxiety and depression 09/06/2019   Broken arm 1996   Contraceptive management 09/26/2012   Contraceptive management 09/26/2012   Encounter for well woman exam with routine gynecological exam 09/06/2019   Hematuria 07/03/2013   Hypertension 09/06/2019   Late period 05/18/2018   LLQ pain 05/18/2018   Migraine headache    Painful urination 07/03/2013   Painful urination 07/03/2013   Patient desires pregnancy 01/15/2020   Postcoital bleeding 12/30/2014   Postcoital bleeding 12/30/2014   Right ovarian cyst 07/13/2013   RUQ pain 07/20/2013   RUQ pain 07/20/2013   Has adenomyomatosis of GB refer to Dr Lovell Sheehan appt 5/28 at 10 am   Thyroid nodule    Tired 12/26/2013   Tired 12/26/2013   Type 2 diabetes mellitus without complication, without long-term current use of insulin (HCC) 04/10/2021   Unspecified symptom associated with female genital organs 07/03/2013    Patient Active Problem List   Diagnosis Date Noted   PTSD (post-traumatic stress disorder) 11/03/2021   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   Generalized anxiety disorder with panic attacks 11/03/2021   Screening examination for STD (sexually transmitted disease) 07/15/2021   Type 2 diabetes mellitus without complication, without long-term current use of insulin (HCC) 04/10/2021   Tobacco use disorder 04/10/2021   Irregular periods 04/10/2021   Breast pain, left 10/08/2020    Tension headache 11/15/2019   Palpitations 10/18/2019   Hypertension 09/06/2019   Vitamin D deficiency 09/07/2018   Encounter for gynecological examination with Papanicolaou smear of cervix 08/30/2018   Elevated cholesterol 08/30/2018   Encounter for vitamin deficiency screening 08/30/2018   Elevated hemoglobin A1c 08/30/2018   Heartburn 08/30/2018   Elevated BP without diagnosis of hypertension 05/18/2018   Right ovarian cyst 07/13/2013    Past Surgical History:  Procedure Laterality Date   CHOLECYSTECTOMY N/A 09/03/2013   Procedure: LAPAROSCOPIC CHOLECYSTECTOMY;  Surgeon: Dalia Heading, MD;  Location: [facility];  Service: General;  Laterality: N/A;   WISDOM TOOTH EXTRACTION      OB History     Gravida  0   Para  0   Term  0   Preterm  0   AB  0   Living  0      SAB  0   IAB  0   Ectopic  0   Multiple  0   Live Births  0            Home Medications    Prior to Admission medications   Medication Sig Start Date End Date Taking? Authorizing Provider  azithromycin (ZITHROMAX) 250 MG tablet Take first 2 tablets together, then 1 every day until finished. 12/12/22  Yes Particia Nearing, PA-C  ciprofloxacin-dexamethasone [facility]) OTIC suspension Place 4 drops into both ears 2 (two) times daily. 12/12/22  Yes Particia Nearing, PA-C  ACCU-CHEK GUIDE test strip USE 1 STRIP TO  CHECK GLUCOSE 4 TIMES DAILY 03/06/21   Cyril Mourning A, NP  Accu-Chek Softclix Lancets lancets USE UP TO 4 TIMES DAILY AS DIRECTED 01/29/21   Adline Potter, NP  blood glucose meter kit and supplies KIT Dispense based on patient and insurance preference. Use up to four times daily as directed. 01/06/21   Adline Potter, NP  Blood Glucose Monitoring Suppl (ACCU-CHEK GUIDE) w/Device KIT Use to monitor glucose once daily 03/18/22   Dani Gobble, NP  busPIRone (BUSPAR) 7.5 MG tablet Take 1 tablet by mouth twice daily 12/06/22   Elsie Lincoln, MD  Cholecalciferol 125  MCG (5000 UT) capsule Take 1 capsule (5,000 Units total) by mouth daily. 09/07/18   Adline Potter, NP  citalopram (CELEXA) 10 MG tablet Take 1 tablet (10 mg total) by mouth at bedtime. 06/01/22 08/30/22  Elsie Lincoln, MD  losartan (COZAAR) 50 MG tablet TAKE 1 TABLET BY MOUTH ONCE DAILY - TIME FOR LABS 11/08/22   Adline Potter, NP  metFORMIN (GLUCOPHAGE-XR) 500 MG 24 hr tablet Take 2 tablets (1,000 mg total) by mouth daily with breakfast. 07/30/22   Dani Gobble, NP  norethindrone (MICRONOR) 0.35 MG tablet Take 1 tablet by mouth once daily 10/20/22   Adline Potter, NP  omeprazole (PRILOSEC) 40 MG capsule Take 1 capsule (40 mg total) by mouth daily. 07/15/22   Adline Potter, NP  simvastatin (ZOCOR) 10 MG tablet Take 1 tablet (10 mg total) by mouth daily. 11/29/22   Adline Potter, NP  vitamin B-12 (CYANOCOBALAMIN) 500 MCG tablet Take 500 mcg by mouth daily.    [provider]    Family History Family History  Problem Relation Age of Onset   Vision loss Mother    Thyroid disease Maternal Aunt    Thyroid cancer Maternal Grandfather    Stroke Paternal Grandmother    Arthritis Paternal Grandmother    COPD Paternal Grandmother    Heart disease Paternal Grandmother    Breast cancer Paternal Grandmother    Lung cancer Paternal Grandmother    Diabetes Paternal Grandfather    Hyperlipidemia Father    Arthritis Father    Prostate cancer Father    Esophageal cancer Neg Hx    Pancreatic cancer Neg Hx    Stomach cancer Neg Hx    Colon cancer Neg Hx    Liver disease Neg Hx     Social History Social History   Tobacco Use   Smoking status: Every Day    Current packs/day: 0.50    Average packs/day: 0.5 packs/day for 20.0 years (10.0 ttl pk-yrs)    Types: Cigarettes, E-cigarettes   Smokeless tobacco: Never   Tobacco comments:    form given 10-22-15. Also vapes at work.  Down to 1-2 cigarettes daily 05/28/2022 but still vaping  Vaping Use   Vaping status:  Every Day  Substance Use Topics   Alcohol use: No   Drug use: Not Currently     Allergies   Adhesive [tape]   Review of Systems Review of Systems PER HPI  Physical Exam Triage Vital Signs ED Triage Vitals  Encounter Vitals Group     BP 12/12/22 1429 (!) 143/94     Systolic BP Percentile --      Diastolic BP Percentile --      Pulse Rate 12/12/22 1429 99     Resp 12/12/22 1429 20     Temp 12/12/22 1429 98.2 F (36.8 C)  Temp Source 12/12/22 1429 Oral     SpO2 12/12/22 1429 97 %     Weight --      Height --      Head Circumference --      Peak Flow --      Pain Score 12/12/22 1430 5     Pain Loc --      Pain Education --      Exclude from Growth Chart --    No data found.  Updated Vital Signs BP (!) 143/94 (BP Location: Right Arm)   Pulse 99   Temp 98.2 F (36.8 C) (Oral)   Resp 20   LMP 12/06/2022 (Approximate)   SpO2 97%   Visual Acuity Right Eye Distance:   Left Eye Distance:   Bilateral Distance:    Right Eye Near:   Left Eye Near:    Bilateral Near:     Physical Exam Vitals and nursing note reviewed.  Constitutional:      Appearance: Normal appearance.  HENT:     Head: Atraumatic.     Right Ear: External ear normal.     Left Ear: External ear normal.     Ears:     Comments: Bilateral TMs erythematous, edematous    Nose: Nose normal.     Mouth/Throat:     Mouth: Mucous membranes are moist.     Pharynx: Posterior oropharyngeal erythema present.  Eyes:     Extraocular Movements: Extraocular movements intact.     Conjunctiva/sclera: Conjunctivae normal.  Cardiovascular:     Rate and Rhythm: Normal rate and regular rhythm.     Heart sounds: Normal heart sounds.  Pulmonary:     Effort: Pulmonary effort is normal.     Breath sounds: Normal breath sounds. No wheezing or rales.  Musculoskeletal:        General: Normal range of motion.     Cervical back: Normal range of motion and neck supple.  Skin:    General: Skin is warm and dry.   Neurological:     Mental Status: She is alert and oriented to person, place, and time.     Motor: No weakness.     Gait: Gait normal.  Psychiatric:        Mood and Affect: Mood normal.        Thought Content: Thought content normal.      UC Treatments / Results  Labs (all labs ordered are listed, but only abnormal results are displayed) Labs Reviewed - No data to display  EKG   Radiology No results found.  Procedures Procedures (including critical care time)  Medications Ordered in UC Medications - No data to display  Initial Impression / Assessment and Plan / UC Course  I have reviewed the triage vital signs and the nursing notes.  Pertinent labs & imaging results that were available during my care of the patient were reviewed by me and considered in my medical decision making (see chart for details).     Will switch to azithromycin as no full clearance of infection with amoxicillin 2 weeks ago.  Continue Flonase, over-the-counter pain relievers.  Patient is requesting an eardrop as well, discussed that as there is no obvious rupture of TM that this would likely not help but will prescribe Ciprodex in case per her recommendation.  Return for worsening symptoms.  Final Clinical Impressions(s) / UC Diagnoses   Final diagnoses:  Acute suppurative otitis media of both ears without spontaneous rupture of tympanic membranes, recurrence  not specified   Discharge Instructions   None    ED Prescriptions     Medication Sig Dispense Auth. Provider   azithromycin (ZITHROMAX) 250 MG tablet Take first 2 tablets together, then 1 every day until finished. 6 tablet Particia Nearing, PA-C   ciprofloxacin-dexamethasone [facility]) OTIC suspension Place 4 drops into both ears 2 (two) times daily. 7.5 mL Particia Nearing, [place]      PDMP not reviewed this encounter.   Particia Nearing, [place] 12/12/22 1516

## 2022-12-12 NOTE — ED Triage Notes (Signed)
History of ear infection 2 weeks ago.  States ears continue to hurt

## 2022-12-13 ENCOUNTER — Telehealth (INDEPENDENT_AMBULATORY_CARE_PROVIDER_SITE_OTHER): Payer: 59 | Admitting: Psychiatry

## 2022-12-13 ENCOUNTER — Encounter (HOSPITAL_COMMUNITY): Payer: Self-pay | Admitting: Psychiatry

## 2022-12-13 DIAGNOSIS — F330 Major depressive disorder, recurrent, mild: Secondary | ICD-10-CM | POA: Diagnosis not present

## 2022-12-13 DIAGNOSIS — F4310 Post-traumatic stress disorder, unspecified: Secondary | ICD-10-CM | POA: Diagnosis not present

## 2022-12-13 DIAGNOSIS — F411 Generalized anxiety disorder: Secondary | ICD-10-CM | POA: Diagnosis not present

## 2022-12-13 DIAGNOSIS — F410 Panic disorder [episodic paroxysmal anxiety] without agoraphobia: Secondary | ICD-10-CM

## 2022-12-13 DIAGNOSIS — F17201 Nicotine dependence, unspecified, in remission: Secondary | ICD-10-CM

## 2022-12-13 MED ORDER — BUSPIRONE HCL 7.5 MG PO TABS
7.5000 mg | ORAL_TABLET | Freq: Two times a day (BID) | ORAL | 4 refills | Status: DC
Start: 2022-12-13 — End: 2023-05-02

## 2022-12-13 MED ORDER — CITALOPRAM HYDROBROMIDE 10 MG PO TABS
10.0000 mg | ORAL_TABLET | Freq: Every evening | ORAL | 4 refills | Status: DC
Start: 2022-12-13 — End: 2023-05-02
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
## 2022-12-13 NOTE — Patient Instructions (Signed)
We did not make any medication changes today.  Keep up the good work with staying off of nicotine and when you get a chance please establish with a primary care provider.  Once that has occurred we will likely get an EKG for monitoring your QTc interval while you are on the citalopram (Celexa).

## 2022-12-22 ENCOUNTER — Ambulatory Visit (INDEPENDENT_AMBULATORY_CARE_PROVIDER_SITE_OTHER): Payer: 59 | Admitting: Psychiatry

## 2022-12-22 ENCOUNTER — Encounter (HOSPITAL_COMMUNITY): Payer: Self-pay

## 2022-12-22 DIAGNOSIS — F330 Major depressive disorder, recurrent, mild: Secondary | ICD-10-CM | POA: Diagnosis not present

## 2022-12-22 DIAGNOSIS — F4310 Post-traumatic stress disorder, unspecified: Secondary | ICD-10-CM | POA: Diagnosis not present
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
## 2023-01-01 ENCOUNTER — Other Ambulatory Visit: Payer: Self-pay | Admitting: Adult Health

## 2023-01-05 ENCOUNTER — Ambulatory Visit (INDEPENDENT_AMBULATORY_CARE_PROVIDER_SITE_OTHER): Payer: 59 | Admitting: Psychiatry

## 2023-01-05 DIAGNOSIS — F4310 Post-traumatic stress disorder, unspecified: Secondary | ICD-10-CM | POA: Diagnosis not present
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
## 2023-01-17 ENCOUNTER — Other Ambulatory Visit: Payer: Self-pay | Admitting: Adult Health

## 2023-02-03 ENCOUNTER — Ambulatory Visit (HOSPITAL_COMMUNITY): Payer: 59 | Admitting: Psychiatry

## 2023-02-17 ENCOUNTER — Telehealth (HOSPITAL_COMMUNITY): Payer: Self-pay | Admitting: Psychiatry

## 2023-02-17 ENCOUNTER — Ambulatory Visit (HOSPITAL_COMMUNITY): Payer: 59 | Admitting: Psychiatry

## 2023-02-17 DIAGNOSIS — F330 Major depressive disorder, recurrent, mild: Secondary | ICD-10-CM | POA: Diagnosis not present

## 2023-02-17 DIAGNOSIS — F4310 Post-traumatic stress disorder, unspecified: Secondary | ICD-10-CM

## 2023-02-17 NOTE — Telephone Encounter (Signed)
Hey! This patient wanted to make a payment at her appointment but I've been having issues with my ipayment so I wasn't able to collect payment. She requested you to call her so she can make a payment. Would you be able to do that for me?
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
## 2023-02-22 NOTE — Telephone Encounter (Signed)
Called pt no answer left vm 

## 2023-03-08 ENCOUNTER — Ambulatory Visit (HOSPITAL_COMMUNITY): Payer: 59 | Admitting: Psychiatry

## 2023-03-11 ENCOUNTER — Ambulatory Visit: Payer: 59 | Admitting: Adult Health

## 2023-03-11 ENCOUNTER — Encounter: Payer: Self-pay | Admitting: Adult Health

## 2023-03-11 VITALS — BP 142/89 | HR 90 | Ht 62.25 in | Wt 253.0 lb

## 2023-03-11 DIAGNOSIS — R102 Pelvic and perineal pain: Secondary | ICD-10-CM | POA: Diagnosis not present

## 2023-03-11 DIAGNOSIS — I10 Essential (primary) hypertension: Secondary | ICD-10-CM | POA: Diagnosis not present

## 2023-03-11 DIAGNOSIS — R110 Nausea: Secondary | ICD-10-CM

## 2023-03-11 DIAGNOSIS — R1032 Left lower quadrant pain: Secondary | ICD-10-CM | POA: Diagnosis not present

## 2023-03-11 MED ORDER — PROMETHAZINE HCL 25 MG PO TABS: 25.0000 mg | ORAL_TABLET | Freq: Four times a day (QID) | ORAL | 1 refills | Status: DC | PRN

## 2023-03-11 MED ORDER — IBUPROFEN 600 MG PO TABS: 600.0000 mg | ORAL_TABLET | Freq: Four times a day (QID) | ORAL | 1 refills | Status: DC | PRN

## 2023-03-11 NOTE — Progress Notes (Signed)
 Subjective:     Patient ID: Rachel Higgins, female   DOB: 1990/12/12, 33 y.o.   MRN: [id number]  HPI Rachel Higgins is a 33 year old white female,single, G0P0, in complaining of pelvic pain, esp LLQ for about a week and some nausea. Denies any problems with bowel movements or urination.     Component Value Date/Time   DIAGPAP  10/15/2021 0844    - Negative for intraepithelial lesion or malignancy (NILM)   DIAGPAP  08/30/2018 0000    NEGATIVE FOR INTRAEPITHELIAL LESIONS OR MALIGNANCY.   HPVHIGH Negative 10/15/2021 0844   ADEQPAP  10/15/2021 0844    Satisfactory for evaluation; transformation zone component PRESENT.   ADEQPAP  08/30/2018 0000    Satisfactory for evaluation  endocervical/transformation zone component PRESENT.    Review of Systems + pelvic pain, esp LLQ for about a week  + some nausea.  Denies any problems with bowel movements or urination No pain with sex but has not had this week Reviewed past medical,surgical, social and family history. Reviewed medications and allergies.     Objective:   Physical Exam BP (!) 142/89 (BP Location: Left Arm, Patient Position: Sitting, Cuff Size: Normal)   Pulse 90   Ht 5' 2.25 (1.581 m)   Wt 253 lb (114.8 kg)   BMI 45.90 kg/m     Skin warm and dry.Pelvic: external genitalia is normal in appearance no lesions, vagina: pink,urethra has no lesions or masses noted, cervix:smooth, no CMT, uterus: normal size, shape and contour, mildly tender, no masses felt, adnexa: no masses, has + tenderness, esp LLQ, no rebound. Bladder is mildly tender and no masses felt. Fall risk is low  Upstream - 03/11/23 1100       Pregnancy Intention Screening   Does the patient want to become pregnant in the next year? Ok Either Way    Does the patient's partner want to become pregnant in the next year? Ok Either Way    Would the patient like to discuss contraceptive options today? No      Contraception Wrap Up   Current Method Oral Contraceptive    End Method  Oral Contraceptive    Contraception Counseling Provided Yes            Examination chaperoned by Clarita Salt LPN  Assessment:     1. Pelvic pain (Primary) +pain for about a week, esp LLQ Can take 2 ES tylenol  with 600 mg ibuprofen  every 6 hours prn pain Will get pelvic US  [facility]/14/[facility] at [facility], be there at 3:15 pm - US  PELVIC COMPLETE WITH TRANSVAGINAL; Future If gets fever or pain increases, go to ER   2. LLQ pain LLQ x 1 week Will get pelvic US  03/14/23 - US  PELVIC COMPLETE WITH TRANSVAGINAL; Future  3. Nausea Will rx phenergan  25 mg 1 every 6 hours prn Meds ordered this encounter  Medications   ibuprofen  (ADVIL ) 600 MG tablet    Sig: Take 1 tablet (600 mg total) by mouth every 6 (six) hours as needed.    Dispense:  60 tablet    Refill:  1    Supervising Provider:   JAYNE MINDER H [[id number]]   promethazine  (PHENERGAN ) 25 MG tablet    Sig: Take 1 tablet (25 mg total) by mouth every 6 (six) hours as needed for nausea or vomiting.    Dispense:  30 tablet    Refill:  1    Supervising Provider:   JAYNE, LUTHER H [[id number]]     4. Hypertension,  unspecified type Take meds and keep check on BP    Plan:     Follow up in 1 week if needed

## 2023-03-14 ENCOUNTER — Ambulatory Visit (HOSPITAL_COMMUNITY)
Admission: RE | Admit: 2023-03-14 | Discharge: 2023-03-14 | Disposition: A | Discharge status: Home / Self Care | Payer: 59 | Source: Ambulatory Visit | Attending: Adult Health | Admitting: Adult Health

## 2023-03-14 DIAGNOSIS — R1032 Left lower quadrant pain: Secondary | ICD-10-CM | POA: Insufficient documentation

## 2023-03-14 DIAGNOSIS — R102 Pelvic and perineal pain: Secondary | ICD-10-CM | POA: Diagnosis present

## 2023-03-18 ENCOUNTER — Ambulatory Visit: Payer: 59 | Admitting: Adult Health

## 2023-03-22 ENCOUNTER — Ambulatory Visit (INDEPENDENT_AMBULATORY_CARE_PROVIDER_SITE_OTHER): Payer: 59 | Admitting: Psychiatry

## 2023-03-22 DIAGNOSIS — F4310 Post-traumatic stress disorder, unspecified: Secondary | ICD-10-CM
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
## 2023-04-06 ENCOUNTER — Ambulatory Visit (INDEPENDENT_AMBULATORY_CARE_PROVIDER_SITE_OTHER): Payer: 59 | Admitting: Psychiatry

## 2023-04-06 ENCOUNTER — Other Ambulatory Visit: Payer: Self-pay | Admitting: *Deleted

## 2023-04-06 DIAGNOSIS — F4310 Post-traumatic stress disorder, unspecified: Secondary | ICD-10-CM | POA: Diagnosis not present

## 2023-04-06 MED ORDER — LOSARTAN POTASSIUM 50 MG PO TABS: ORAL_TABLET | ORAL | 3 refills | Status: DC
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
## 2023-04-07 ENCOUNTER — Ambulatory Visit: Payer: 59 | Admitting: Adult Health

## 2023-04-07 ENCOUNTER — Encounter: Payer: Self-pay | Admitting: Adult Health

## 2023-04-07 VITALS — BP 127/85 | HR 96 | Ht 62.25 in | Wt 253.0 lb

## 2023-04-07 DIAGNOSIS — E119 Type 2 diabetes mellitus without complications: Secondary | ICD-10-CM

## 2023-04-07 DIAGNOSIS — F419 Anxiety disorder, unspecified: Secondary | ICD-10-CM

## 2023-04-07 DIAGNOSIS — Z1329 Encounter for screening for other suspected endocrine disorder: Secondary | ICD-10-CM

## 2023-04-07 DIAGNOSIS — Z01419 Encounter for gynecological examination (general) (routine) without abnormal findings: Secondary | ICD-10-CM | POA: Diagnosis not present

## 2023-04-07 DIAGNOSIS — E7800 Pure hypercholesterolemia, unspecified: Secondary | ICD-10-CM | POA: Diagnosis not present

## 2023-04-07 DIAGNOSIS — Z1331 Encounter for screening for depression: Secondary | ICD-10-CM | POA: Diagnosis not present

## 2023-04-07 DIAGNOSIS — F32A Depression, unspecified: Secondary | ICD-10-CM

## 2023-04-07 DIAGNOSIS — Z3041 Encounter for surveillance of contraceptive pills: Secondary | ICD-10-CM

## 2023-04-07 DIAGNOSIS — I10 Essential (primary) hypertension: Secondary | ICD-10-CM

## 2023-04-07 MED ORDER — ACCU-CHEK SOFTCLIX LANCETS MISC: 6 refills | Status: AC

## 2023-04-07 NOTE — Progress Notes (Signed)
 Patient ID: Rachel Higgins, female   DOB: 1990-10-03, 33 y.o.   MRN: [id number] History of Present Illness: Rachel Higgins is a 33 year old white female, single, G0P0, in for a well woman gyn exam.     Component Value Date/Time   DIAGPAP  10/15/2021 0844    - Negative for intraepithelial lesion or malignancy (NILM)   DIAGPAP  08/30/2018 0000    NEGATIVE FOR INTRAEPITHELIAL LESIONS OR MALIGNANCY.   HPVHIGH Negative 10/15/2021 0844   ADEQPAP  10/15/2021 0844    Satisfactory for evaluation; transformation zone component PRESENT.   ADEQPAP  08/30/2018 0000    Satisfactory for evaluation  endocervical/transformation zone component PRESENT.     Current Medications, Allergies, Past Medical History, Past Surgical History, Family History and Social History were reviewed in [employer] record.     Review of Systems: Patient denies any headaches, hearing loss, fatigue, blurred vision, shortness of breath, chest pain, abdominal pain, problems with bowel movements, urination, or intercourse. No joint pain or mood swings.  Still has pelvic pain at times, had normal US  03/14/23 Periods light  Physical Exam:BP 127/85 (BP Location: Left Arm, Patient Position: Sitting, Cuff Size: Large)   Pulse 96   Ht 5' 2.25 (1.581 m)   Wt 253 lb (114.8 kg)   BMI 45.90 kg/m   General:  Well developed, well nourished, no acute distress Skin:  Warm and dry Neck:  Midline trachea, normal thyroid , good ROM, no lymphadenopathy Lungs; Clear to auscultation bilaterally Breast:  No dominant palpable mass, retraction, or nipple discharge Cardiovascular: Regular rate and rhythm Abdomen:  Soft, non tender, no hepatosplenomegaly Pelvic:  External genitalia is normal in appearance, no lesions.  The vagina is normal in appearance. Urethra has no lesions or masses. The cervix is smooth.  Uterus is felt to be normal size, shape, and contour.  No adnexal masses, mild tenderness noted.Bladder is non tender, no masses  felt. Extremities/musculoskeletal:  No swelling or varicosities noted, no clubbing or cyanosis Psych:  No mood changes, alert and cooperative,seems happy AA is 1 Fall risk is low    04/07/2023    3:38 PM 12/06/2022    9:12 AM 01/07/2022    8:55 AM  Depression screen PHQ 2/9  Decreased Interest 1  1  Down, Depressed, Hopeless 0  1  PHQ - 2 Score 1  2  Altered sleeping 2  1  Tired, decreased energy 2  1  Change in appetite 1  1  Feeling bad or failure about yourself  0  1  Trouble concentrating 1  3  Moving slowly or fidgety/restless 1  3  Suicidal thoughts 0  0  PHQ-9 Score 8  12  Difficult doing work/chores   Not difficult at all     Information is confidential and restricted. Go to Review Flowsheets to unlock data.       04/07/2023    3:38 PM 12/06/2022    9:17 AM 01/07/2022    8:57 AM 11/09/2021    1:17 PM  GAD 7 : Generalized Anxiety Score  Nervous, Anxious, on Edge 1  2   Control/stop worrying 1  2   Worry too much - different things 1  2   Trouble relaxing 1  1   Restless 1  2   Easily annoyed or irritable 2  1   Afraid - awful might happen 1  1   Total GAD 7 Score 8  11   Anxiety Difficulty   Somewhat difficult  Information is confidential and restricted. Go to Review Flowsheets to unlock data.      Upstream - 04/07/23 1547       Pregnancy Intention Screening   Does the patient want to become pregnant in the next year? Ok Either Way    Does the patient's partner want to become pregnant in the next year? Ok Either Way    Would the patient like to discuss contraceptive options today? No      Contraception Wrap Up   Current Method Oral Contraceptive    End Method Oral Contraceptive    Contraception Counseling Provided Yes            Examination chaperoned by Clarita Salt LPN  Impression and plan: 1. Encounter for well woman exam with routine gynecological exam (Primary) Pap and physical in 1 year Will check labs fasting  - CBC - Comprehensive  metabolic panel - Lipid panel  2. Hypertension, unspecified type On cozaar  50 mg 1 daily   3. Type 2 diabetes mellitus without complication, without long-term current use of insulin (HCC) On metformin  5000 mg 1 bid  SR 24 hr - Hemoglobin A1c She has not seen [facility] lately   4. Elevated cholesterol On zocor  10 mg 1 daily  - Lipid panel  5. Anxiety and depression Sees Dr Barbra and is on celexa  and buspar   6. Screening for thyroid  disorder - TSH + free T4  7. Encounter for surveillance of contraceptive pills Periods light on Micronor , has refills

## 2023-04-21 ENCOUNTER — Ambulatory Visit (HOSPITAL_COMMUNITY): Payer: 59 | Admitting: Psychiatry

## 2023-04-21 ENCOUNTER — Encounter (HOSPITAL_COMMUNITY): Payer: Self-pay

## 2023-04-30 ENCOUNTER — Other Ambulatory Visit (HOSPITAL_COMMUNITY): Payer: Self-pay | Admitting: Psychiatry

## 2023-04-30 DIAGNOSIS — F331 Major depressive disorder, recurrent, moderate: Secondary | ICD-10-CM

## 2023-04-30 DIAGNOSIS — F4310 Post-traumatic stress disorder, unspecified: Secondary | ICD-10-CM

## 2023-04-30 DIAGNOSIS — F410 Panic disorder [episodic paroxysmal anxiety] without agoraphobia: Secondary | ICD-10-CM

## 2023-05-02 ENCOUNTER — Encounter (HOSPITAL_COMMUNITY): Payer: Self-pay | Admitting: Psychiatry

## 2023-05-02 ENCOUNTER — Telehealth (INDEPENDENT_AMBULATORY_CARE_PROVIDER_SITE_OTHER): Payer: 59 | Admitting: Psychiatry

## 2023-05-02 DIAGNOSIS — F411 Generalized anxiety disorder: Secondary | ICD-10-CM

## 2023-05-02 DIAGNOSIS — Z720 Tobacco use: Secondary | ICD-10-CM

## 2023-05-02 DIAGNOSIS — F330 Major depressive disorder, recurrent, mild: Secondary | ICD-10-CM | POA: Diagnosis not present

## 2023-05-02 DIAGNOSIS — F17290 Nicotine dependence, other tobacco product, uncomplicated: Secondary | ICD-10-CM

## 2023-05-02 DIAGNOSIS — F410 Panic disorder [episodic paroxysmal anxiety] without agoraphobia: Secondary | ICD-10-CM

## 2023-05-02 DIAGNOSIS — F4310 Post-traumatic stress disorder, unspecified: Secondary | ICD-10-CM | POA: Diagnosis not present

## 2023-05-02 MED ORDER — CITALOPRAM HYDROBROMIDE 10 MG PO TABS: 10.0000 mg | ORAL_TABLET | Freq: Every evening | ORAL | 2 refills | Status: DC

## 2023-05-02 MED ORDER — BUSPIRONE HCL 7.5 MG PO TABS: 7.5000 mg | ORAL_TABLET | Freq: Two times a day (BID) | ORAL | 2 refills | Status: DC
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
## 2023-05-02 NOTE — Patient Instructions (Addendum)
 We did not make any medication changes today.  As you can, try to start cutting back on the amount of caffeine you are consuming daily and that should help your anxiety and insomnia more.  Similarly, please reach out to the quit Line which you can find on this website and they can mail you smoking cessation resources for free: http://skinner-smith.org/  If you get the nicotine patches it is okay to continue to use the e-cigarettes while you have them on just be sure to take off before bedtime.  When you get a chance if you have not established with a primary care provider yet please do so as it can be helpful to get an EKG while you are on the citalopram.

## 2023-05-05 ENCOUNTER — Ambulatory Visit (INDEPENDENT_AMBULATORY_CARE_PROVIDER_SITE_OTHER): Payer: 59 | Admitting: Psychiatry

## 2023-05-05 DIAGNOSIS — F330 Major depressive disorder, recurrent, mild: Secondary | ICD-10-CM

## 2023-05-05 DIAGNOSIS — F4310 Post-traumatic stress disorder, unspecified: Secondary | ICD-10-CM | POA: Diagnosis not present
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
## 2023-05-18 ENCOUNTER — Other Ambulatory Visit: Payer: Self-pay | Admitting: Adult Health

## 2023-05-19 ENCOUNTER — Ambulatory Visit (INDEPENDENT_AMBULATORY_CARE_PROVIDER_SITE_OTHER): Payer: 59 | Admitting: Psychiatry

## 2023-05-19 DIAGNOSIS — F4310 Post-traumatic stress disorder, unspecified: Secondary | ICD-10-CM | POA: Diagnosis not present

## 2023-05-19 DIAGNOSIS — F330 Major depressive disorder, recurrent, mild: Secondary | ICD-10-CM
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
## 2023-05-24 ENCOUNTER — Other Ambulatory Visit: Payer: Self-pay | Admitting: Adult Health

## 2023-06-02 ENCOUNTER — Ambulatory Visit (INDEPENDENT_AMBULATORY_CARE_PROVIDER_SITE_OTHER): Payer: 59 | Admitting: Psychiatry

## 2023-06-02 DIAGNOSIS — F4310 Post-traumatic stress disorder, unspecified: Secondary | ICD-10-CM | POA: Diagnosis not present
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
## 2023-06-16 ENCOUNTER — Ambulatory Visit (HOSPITAL_COMMUNITY): Payer: 59 | Admitting: Psychiatry

## 2023-06-16 DIAGNOSIS — F4310 Post-traumatic stress disorder, unspecified: Secondary | ICD-10-CM | POA: Diagnosis not present
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
## 2023-07-04 ENCOUNTER — Encounter (HOSPITAL_COMMUNITY): Payer: Self-pay

## 2023-07-04 ENCOUNTER — Ambulatory Visit (INDEPENDENT_AMBULATORY_CARE_PROVIDER_SITE_OTHER): Admitting: Psychiatry

## 2023-07-04 DIAGNOSIS — F4310 Post-traumatic stress disorder, unspecified: Secondary | ICD-10-CM | POA: Diagnosis not present
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
## 2023-07-18 ENCOUNTER — Encounter (HOSPITAL_COMMUNITY): Payer: Self-pay | Admitting: Psychiatry

## 2023-07-18 ENCOUNTER — Telehealth (HOSPITAL_COMMUNITY): Admitting: Psychiatry

## 2023-07-18 DIAGNOSIS — Z720 Tobacco use: Secondary | ICD-10-CM

## 2023-07-18 DIAGNOSIS — F411 Generalized anxiety disorder: Secondary | ICD-10-CM

## 2023-07-18 DIAGNOSIS — F4310 Post-traumatic stress disorder, unspecified: Secondary | ICD-10-CM | POA: Diagnosis not present

## 2023-07-18 DIAGNOSIS — F3342 Major depressive disorder, recurrent, in full remission: Secondary | ICD-10-CM

## 2023-07-18 DIAGNOSIS — F17290 Nicotine dependence, other tobacco product, uncomplicated: Secondary | ICD-10-CM | POA: Diagnosis not present

## 2023-07-18 DIAGNOSIS — F410 Panic disorder [episodic paroxysmal anxiety] without agoraphobia: Secondary | ICD-10-CM

## 2023-07-18 MED ORDER — CITALOPRAM HYDROBROMIDE 10 MG PO TABS: 10.0000 mg | ORAL_TABLET | Freq: Every evening | ORAL | 5 refills | Status: AC

## 2023-07-18 MED ORDER — BUSPIRONE HCL 7.5 MG PO TABS
7.5000 mg | ORAL_TABLET | Freq: Two times a day (BID) | ORAL | 5 refills | Status: AC
Start: 2023-07-18 — End: ?

## 2023-07-18 NOTE — Progress Notes (Signed)
 [facility] MD Outpatient Progress Note  07/18/2023 3:45 PM Rachel Higgins  MRN:  [id number]  Assessment:  Rachel Higgins presents for follow-up evaluation. Today, 07/18/23, patient reports well-controlled symptoms of depression and anxiety and would consider depression to be in partial remission at this point.  Still finding a lot of benefit in psychotherapy.  A trial of a lower dose of citalopram  5 mg before trying to come off of it with a goal of not being on medicines if possible.  Admittedly this is an odd regimen with the BuSpar  but has been the most effective for her to date. Still no longer endorsing SI.  Reviewed likely benefit from cutting back on afternoon caffeine as it pertains to sleep and anxiety and she will consider this though historically has not made changes.  Biggest benefit to her inadequate sleep was getting a sleep study and being found to have sleep apnea for which she is now on a CPAP machine.  She needs to obtain a PCP but once she does will get ecg. Safety assessment overall unchanged from previous encounter.  While she has stopped cigarette smoking unfortunately has replaced this with e-cigarettes and is still precontemplative with cessation of those.  No follow-up planned due to provider transition.  Identifying Information: Rachel Higgins is a 33 y.o. y.o. female with a history of PTSD, major depressive disorder, generalized anxiety disorder with panic attacks, OSA on CPAP, and tobacco use disorder who is an established patient with [facility] participating in follow-up via video conferencing. She appeared to have a partial response to sertraline  therapy and her preference was to keep medications to a minimum where possible. To that end, planned cross taper sertraline  to citalopram  as outlined in plan with the aim of avoiding serotonin syndrome given that she was also on buspar . The latter appears to be tentatively effective for her panic attacks as recent increase led to  remission of panic. She was able to purchase a new car having built up funds from working in December 2023.  Plan:  # PTSD Past medication trials: sertraline  Status of problem: Well-controlled Interventions: -- Continue citalopram  10mg  once nightly (s9/20/23) -- continue psychotherapy   # Major depressive disorder, recurrent, mild Past medication trials: sertraline  Status of problem: Well-controlled Interventions: -- celexa  as above -- psychotherapy as above   # Generalized anxiety disorder with panic attacks Past medication trials: sertraline , buspar  Status of problem: Well-controlled Interventions: -- celexa , psychotherapy as above -- continue buspar  7.5mg  BID  # OSA on CPAP Past medication trials: sertraline , buspar  Status of problem: Improving Interventions: -- Continue CPAP   # Vapes nicotine-containing substance  history of cigarette smoking, early remission Past medication trials: none Status of problem: Chronic and stable Interventions: -- Tobacco cessation counseling provided  Patient was given contact information for behavioral health clinic and was instructed to call 911 for emergencies.   Subjective:  Chief Complaint:  Chief Complaint  Patient presents with   Anxiety   Depression   Stress   Follow-up    Interval History: Doing not too bad today. Overall things have been pretty good. Still going to the gym daily after slacking off for a bit previously. Hanging out with new romantic interest. Have been together for about a year. Was diagnosed with sleep apnea and now has a CPAP which has been helping with energy; for the last month. Avoiding full face mask due to PTSD but nose only one working well. Enjoying therapy with Rachel Higgins. Typically caffeine still 1 coffee  per day but it is in a large Stanley cup and lasts until 12p. Vaping cartridge still lasts 2 weeks; hasn't reached out to quitline. Still mostly does 3 meals per day, no binge episodes or  intentional restriction but can forget to eat if too busy. No SI.   Visit Diagnosis:    ICD-10-CM   1. PTSD (post-traumatic stress disorder)  F43.10 citalopram  (CELEXA ) 10 MG tablet    2. Recurrent major depressive disorder, in full remission (HCC)  F33.42 citalopram  (CELEXA ) 10 MG tablet    3. Generalized anxiety disorder with panic attacks  F41.1 citalopram  (CELEXA ) 10 MG tablet   F41.0 busPIRone  (BUSPAR ) 7.5 MG tablet    4. Vapes nicotine containing substance  Z72.0           Allergies:  Allergies  Allergen Reactions   Adhesive [Tape]     Rash and burns skin     Current Medications: Current Outpatient Medications  Medication Sig Dispense Refill   ACCU-CHEK GUIDE test strip USE 1 STRIP TO CHECK GLUCOSE 4 TIMES DAILY 100 each prn   Accu-Chek Softclix Lancets lancets Use as instructed 100 each 6   blood glucose meter kit and supplies KIT Dispense based on patient and insurance preference. Use up to four times daily as directed. 1 each 0   Blood Glucose Monitoring Suppl (ACCU-CHEK GUIDE) w/Device KIT Use to monitor glucose once daily 1 kit 0   busPIRone  (BUSPAR ) 7.5 MG tablet Take 1 tablet (7.5 mg total) by mouth 2 (two) times daily. 60 tablet 5   Cholecalciferol  125 MCG (5000 UT) capsule Take 1 capsule (5,000 Units total) by mouth daily.     citalopram  (CELEXA ) 10 MG tablet Take 1 tablet (10 mg total) by mouth at bedtime. 30 tablet 5   ibuprofen  (ADVIL ) 600 MG tablet Take 1 tablet (600 mg total) by mouth every 6 (six) hours as needed. 60 tablet 1   losartan  (COZAAR ) 50 MG tablet Take 1 tablet by mouth once daily 30 tablet 3   metFORMIN  (GLUCOPHAGE -XR) 500 MG 24 hr tablet Take 2 tablets (1,000 mg total) by mouth daily with breakfast. 180 tablet 3   norethindrone  (MICRONOR ) 0.35 MG tablet Take 1 tablet by mouth once daily 28 tablet 12   omeprazole  (PRILOSEC) 40 MG capsule Take 1 capsule by mouth once daily 30 capsule 6   simvastatin  (ZOCOR ) 10 MG tablet Take 1 tablet by mouth  once daily 90 tablet 0   vitamin B-12 (CYANOCOBALAMIN) 500 MCG tablet Take 500 mcg by mouth daily.     No current facility-administered medications for this visit.    ROS: Review of Systems  Neurological:  Negative for dizziness.  Psychiatric/Behavioral:  Negative for dysphoric mood, sleep disturbance and suicidal ideas. The patient is not nervous/anxious.     Objective:  Psychiatric Specialty Exam: There were no vitals taken for this visit.There is no height or weight on file to calculate BMI.  General Appearance: Neat, Well Groomed, and wearing glasses. In work Public relations account executive. Appears stated age.  Eye Contact:  Good  Speech:  Clear and Coherent and Normal Rate  Volume:  Normal  Mood:  "Good!"  Affect:  decreased range within cheerful, calm, cooperative.  Thought Process:  Coherent, Goal Directed, and Linear  Orientation:  Full (Time, Place, and Person)  Thought Content: Logical and Hallucinations: None   Suicidal Thoughts:  No  Homicidal Thoughts:  No  Memory:  Immediate;   Good  Judgment:  Fair  Insight:  Fair  Psychomotor  Activity:  Normal  Concentration:  Concentration: Fair and Attention Span: Fair  Recall:  Good  Fund of Knowledge: Good  Language: Good  Akathisia:  No  Handed:  Right  AIMS (if indicated): not done  Assets:  Communication Skills Desire for Improvement Financial Resources/Insurance Housing Leisure Time Physical Health Resilience Social Support Talents/Skills Transportation Vocational/Educational  ADL's:  Intact  Cognition: WNL  Sleep:  Fair   PE: General: sits comfortably in view of camera; no acute distress. Wearing glasses. Pulm: no increased work of breathing on room air.  MSK: all extremity movements appear intact  Neuro: no focal neurological deficits observed  Gait & Station: unable to assess by video    Metabolic Disorder Labs: Lab Results  Component Value Date   HGBA1C 6.4 (A) 07/30/2022   No results found for: "PROLACTIN" Lab  Results  Component Value Date   CHOL 160 07/21/2022   TRIG 108 07/21/2022   HDL 41 07/21/2022   CHOLHDL 3.9 07/21/2022   LDLCALC 99 07/21/2022   LDLCALC 94 11/27/2021   Lab Results  Component Value Date   TSH 1.570 07/21/2022   TSH 2.070 01/21/2020    Therapeutic Level Labs: No results found for: "LITHIUM" No results found for: "VALPROATE" No results found for: "CBMZ"  Screenings: GAD-7    Flowsheet Row Office Visit from [facility]/07/2023 in [facility] Counselor from 12/06/2022 in [facility] [place] Office Visit from 01/07/2022 in [facility] for [facility] at [facility] Counselor from 11/09/2021 in [facility] [place] Office Visit from 10/15/2021 in [facility] at [facility]  Total GAD-7 Score 8 10 11 17 13       PHQ2-9    Flowsheet Row Office Visit from 04/07/2023 in [facility] for [facility] at [facility] Counselor from 12/06/2022 in [facility] [place] Office Visit from 01/07/2022 in [facility] for [facility] at [facility] Counselor from 11/09/2021 in [facility] at [place] Video Visit from 11/03/2021 in [facility] at [facility] Total Score 1 2 2 4 3   PHQ-9 Total Score 8 14 12 18 15       Flowsheet Row UC from 12/12/2022 in [facility] at [facility] from 12[facility]06/2022 in [facility] at [place] UC from 11/25/2022 in [facility] at [facility]  C-SSRS RISK CATEGORY No Risk No Risk No Risk       Collaboration of Care: Collaboration of Care: Medication Management AEB as above, Primary Care Provider AEB as above, and Referral or follow-up with counselor/therapist AEB as above  Patient/Guardian was advised Release of Information must be  obtained prior to any record release in order to collaborate their care with an outside provider. Patient/Guardian was advised if they have not already done so to contact the registration department to sign all necessary forms in order for us  to release information regarding their care.   Consent: Patient/Guardian gives verbal consent for treatment and assignment of benefits for services provided during this visit. Patient/Guardian expressed understanding and agreed to proceed.   Televisit via video: I connected with Rachel Higgins on 07/18/23 at  3:30 PM EDT by a video enabled telemedicine application and verified that I am speaking with the correct person using two identifiers.  Location: Patient: at work [employer]'s [employer]: home office  I discussed the limitations of evaluation and management by telemedicine and the availability of in person appointments. The patient expressed understanding and agreed to proceed.  I discussed the assessment and treatment plan with the patient. The patient was provided an opportunity to ask questions and all were answered. The patient agreed with the plan and demonstrated an understanding of the instructions.   The patient was advised to call back or seek an in-person evaluation if the symptoms worsen or if the condition fails to improve as anticipated.  I provided 15 minutes of virtual face-to-face time during this encounter.  Rachel Schilling, MD 07/18/2023, 3:45 PM

## 2023-07-18 NOTE — Patient Instructions (Signed)
 We did not make any changes to your medicine today.  If you want to try taking half a tablet of the citalopram  (Celexa ) it is okay to do so.  If you need to stay with the [facility] health system for your psychiatry care moving forward try calling the [place] office or the [facility] office on [address].

## 2023-07-20 ENCOUNTER — Ambulatory Visit (INDEPENDENT_AMBULATORY_CARE_PROVIDER_SITE_OTHER): Admitting: Psychiatry

## 2023-07-20 DIAGNOSIS — F4310 Post-traumatic stress disorder, unspecified: Secondary | ICD-10-CM | POA: Diagnosis not present
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
## 2023-07-22 ENCOUNTER — Other Ambulatory Visit: Payer: Self-pay | Admitting: Nurse Practitioner

## 2023-08-03 ENCOUNTER — Ambulatory Visit (INDEPENDENT_AMBULATORY_CARE_PROVIDER_SITE_OTHER): Admitting: Psychiatry

## 2023-08-03 DIAGNOSIS — F4310 Post-traumatic stress disorder, unspecified: Secondary | ICD-10-CM | POA: Diagnosis not present
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
## 2023-08-09 ENCOUNTER — Other Ambulatory Visit: Payer: Self-pay | Admitting: *Deleted

## 2023-08-09 MED ORDER — LOSARTAN POTASSIUM 50 MG PO TABS: ORAL_TABLET | ORAL | 3 refills | Status: DC

## 2023-08-12 ENCOUNTER — Other Ambulatory Visit: Payer: Self-pay | Admitting: Adult Health

## 2023-08-17 ENCOUNTER — Ambulatory Visit (INDEPENDENT_AMBULATORY_CARE_PROVIDER_SITE_OTHER): Admitting: Psychiatry

## 2023-08-17 DIAGNOSIS — F3342 Major depressive disorder, recurrent, in full remission: Secondary | ICD-10-CM

## 2023-08-17 DIAGNOSIS — F4310 Post-traumatic stress disorder, unspecified: Secondary | ICD-10-CM | POA: Diagnosis not present
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
## 2023-09-01 ENCOUNTER — Ambulatory Visit (HOSPITAL_COMMUNITY): Admitting: Psychiatry

## 2023-09-21 ENCOUNTER — Ambulatory Visit (INDEPENDENT_AMBULATORY_CARE_PROVIDER_SITE_OTHER): Admitting: Psychiatry

## 2023-09-21 DIAGNOSIS — F4310 Post-traumatic stress disorder, unspecified: Secondary | ICD-10-CM | POA: Diagnosis not present
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
## 2023-09-29 ENCOUNTER — Encounter (HOSPITAL_COMMUNITY): Payer: Self-pay

## 2023-10-04 ENCOUNTER — Other Ambulatory Visit: Payer: Self-pay | Admitting: *Deleted

## 2023-10-04 MED ORDER — SIMVASTATIN 10 MG PO TABS: 10.0000 mg | ORAL_TABLET | Freq: Every day | ORAL | 0 refills | Status: DC

## 2023-10-05 ENCOUNTER — Ambulatory Visit (HOSPITAL_COMMUNITY): Admitting: Psychiatry

## 2023-10-18 ENCOUNTER — Other Ambulatory Visit: Payer: Self-pay | Admitting: Nurse Practitioner

## 2023-10-19 ENCOUNTER — Ambulatory Visit (HOSPITAL_COMMUNITY): Admitting: Psychiatry

## 2023-10-28 ENCOUNTER — Other Ambulatory Visit: Payer: Self-pay | Admitting: Otolaryngology

## 2023-11-02 ENCOUNTER — Other Ambulatory Visit: Payer: Self-pay | Admitting: Nurse Practitioner

## 2023-11-10 ENCOUNTER — Other Ambulatory Visit: Payer: Self-pay | Admitting: Nurse Practitioner

## 2023-11-14 ENCOUNTER — Telehealth: Payer: Self-pay | Admitting: Nurse Practitioner

## 2023-11-14 ENCOUNTER — Other Ambulatory Visit: Payer: Self-pay | Admitting: Otolaryngology

## 2023-11-14 DIAGNOSIS — E119 Type 2 diabetes mellitus without complications: Secondary | ICD-10-CM

## 2023-11-14 DIAGNOSIS — E559 Vitamin D deficiency, unspecified: Secondary | ICD-10-CM

## 2023-11-14 DIAGNOSIS — Z7984 Long term (current) use of oral hypoglycemic drugs: Secondary | ICD-10-CM

## 2023-11-14 NOTE — Telephone Encounter (Signed)
 Pt is scheduled for 02/02/24, can you put in labs for her

## 2023-11-14 NOTE — Telephone Encounter (Signed)
 done

## 2023-11-15 ENCOUNTER — Other Ambulatory Visit: Payer: Self-pay

## 2023-11-15 ENCOUNTER — Encounter
Admission: RE | Admit: 2023-11-15 | Discharge: 2023-11-15 | Disposition: A | Discharge status: Home / Self Care | Source: Ambulatory Visit | Attending: Otolaryngology | Admitting: Otolaryngology

## 2023-11-15 VITALS — Ht 62.25 in | Wt 250.0 lb

## 2023-11-15 DIAGNOSIS — Z01810 Encounter for preprocedural cardiovascular examination: Secondary | ICD-10-CM

## 2023-11-15 DIAGNOSIS — I10 Essential (primary) hypertension: Secondary | ICD-10-CM

## 2023-11-15 DIAGNOSIS — Z01812 Encounter for preprocedural laboratory examination: Secondary | ICD-10-CM

## 2023-11-15 NOTE — Patient Instructions (Addendum)
 Your procedure is scheduled on:  TUESDAY SEPTEMBER 23  Report to the Registration Desk on the 1st floor of the [facility] To find out your arrival time, please call [phone] between 1PM - 3PM on:  MONDAY SEPTEMBER 22 If your arrival time is 6:00 am, do not arrive before that time as the [facility] entrance doors do not open until 6:00 am.  REMEMBER: Instructions that are not followed completely may result in serious medical risk, up to and including death; or upon the discretion of your surgeon and anesthesiologist your surgery may need to be rescheduled.  Do not eat food after midnight the night before surgery.  No gum chewing or hard candies.   One week prior to surgery: Stop Anti-inflammatories (NSAIDS) such as Advil , Aleve, Ibuprofen , Motrin , Naproxen, Naprosyn and Aspirin based products such as Excedrin, Goody's Powder, BC Powder. Stop ANY OVER THE COUNTER supplements until after surgery. Cholecalciferol  (Vitamin D )  vitamin B-12 (CYANOCOBALAMIN)  You may however, continue to take Tylenol  if needed for pain up until the day of surgery.  **Follow guidelines for insulin and diabetes medications.** metFORMIN  (GLUCOPHAGE -XR) hold 2 days prior to surgery, last dose SATURDAY SEPTEMBER 20   Continue taking all of your other prescription medications up until the day of surgery.  ON THE DAY OF SURGERY ONLY TAKE THESE MEDICATIONS WITH SIPS OF WATER:  busPIRone  (BUSPAR )  omeprazole  (PRILOSEC)   No Alcohol for 24 hours before or after surgery.  No Smoking including e-cigarettes for 24 hours before surgery.   Do not use any recreational drugs for at least a week (preferably 2 weeks) before your surgery.  Please be advised that the combination of cocaine and anesthesia may have negative outcomes, up to and including death. If you test positive for cocaine, your surgery will be cancelled.  On the morning of surgery brush your teeth with toothpaste and water, you may rinse your  mouth with mouthwash if you wish. Do not swallow any toothpaste or mouthwash.  Do not wear jewelry, make-up, hairpins, clips or nail polish.  For welded (permanent) jewelry: bracelets, anklets, waist bands, etc.  Please have this removed prior to surgery.  If it is not removed, there is a chance that hospital personnel will need to cut it off on the day of surgery.  Do not wear lotions, powders, or perfumes.   Do not bring valuables to the hospital. [facility] is not responsible for any missing/lost belongings or valuables.   Notify your doctor if there is any change in your medical condition (cold, fever, infection).  Wear comfortable clothing (specific to your surgery type) to the hospital.  After surgery, you can help prevent lung complications by doing breathing exercises.  Take deep breaths and cough every 1-2 hours.  If you are being discharged the day of surgery, you will not be allowed to drive home. You will need a responsible individual to drive you home and stay with you for 24 hours after surgery.   If you are taking public transportation, you will need to have a responsible individual with you.  Please call the Pre-admissions Testing Dept. at [phone] if you have any questions about these instructions.  Surgery Visitation Policy:  Patients having surgery or a procedure may have two visitors.  Children under the age of 38 must have an adult with them who is not the patient.  Merchandiser, retail to address health-related social needs:  [email or web address]

## 2023-11-16 ENCOUNTER — Encounter
Admission: RE | Admit: 2023-11-16 | Discharge: 2023-11-16 | Disposition: A | Discharge status: Home / Self Care | Source: Ambulatory Visit | Attending: Otolaryngology | Admitting: Otolaryngology

## 2023-11-16 DIAGNOSIS — Z01810 Encounter for preprocedural cardiovascular examination: Secondary | ICD-10-CM | POA: Diagnosis present

## 2023-11-16 DIAGNOSIS — I10 Essential (primary) hypertension: Secondary | ICD-10-CM | POA: Diagnosis not present

## 2023-11-17 ENCOUNTER — Ambulatory Visit (HOSPITAL_COMMUNITY): Admitting: Psychiatry

## 2023-11-17 DIAGNOSIS — F329 Major depressive disorder, single episode, unspecified: Secondary | ICD-10-CM | POA: Diagnosis not present

## 2023-11-17 DIAGNOSIS — F4310 Post-traumatic stress disorder, unspecified: Secondary | ICD-10-CM
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
## 2023-11-22 ENCOUNTER — Other Ambulatory Visit: Payer: Self-pay

## 2023-11-22 ENCOUNTER — Ambulatory Visit: Payer: Self-pay | Admitting: Urgent Care

## 2023-11-22 ENCOUNTER — Encounter: Payer: Self-pay | Admitting: Otolaryngology

## 2023-11-22 ENCOUNTER — Encounter
Admission: RE | Disposition: A | Discharge status: Home / Self Care | Payer: Self-pay | Source: Home / Self Care | Attending: Otolaryngology

## 2023-11-22 ENCOUNTER — Ambulatory Visit
Admission: RE | Admit: 2023-11-22 | Discharge: 2023-11-22 | Disposition: A | Discharge status: Home / Self Care | Attending: Otolaryngology | Admitting: Otolaryngology

## 2023-11-22 DIAGNOSIS — J351 Hypertrophy of tonsils: Secondary | ICD-10-CM | POA: Diagnosis present

## 2023-11-22 DIAGNOSIS — Z6841 Body Mass Index (BMI) 40.0 and over, adult: Secondary | ICD-10-CM | POA: Insufficient documentation

## 2023-11-22 DIAGNOSIS — G4733 Obstructive sleep apnea (adult) (pediatric): Secondary | ICD-10-CM | POA: Insufficient documentation

## 2023-11-22 DIAGNOSIS — E66813 Obesity, class 3: Secondary | ICD-10-CM | POA: Diagnosis not present

## 2023-11-22 DIAGNOSIS — I10 Essential (primary) hypertension: Secondary | ICD-10-CM | POA: Diagnosis not present

## 2023-11-22 DIAGNOSIS — K219 Gastro-esophageal reflux disease without esophagitis: Secondary | ICD-10-CM | POA: Diagnosis not present

## 2023-11-22 DIAGNOSIS — E119 Type 2 diabetes mellitus without complications: Secondary | ICD-10-CM | POA: Diagnosis not present

## 2023-11-22 DIAGNOSIS — Z7984 Long term (current) use of oral hypoglycemic drugs: Secondary | ICD-10-CM | POA: Diagnosis not present

## 2023-11-22 DIAGNOSIS — F17200 Nicotine dependence, unspecified, uncomplicated: Secondary | ICD-10-CM | POA: Insufficient documentation

## 2023-11-22 DIAGNOSIS — Z01812 Encounter for preprocedural laboratory examination: Secondary | ICD-10-CM

## 2023-11-22 LAB — POCT PREGNANCY, URINE: Preg Test, Ur: NEGATIVE

## 2023-11-22 LAB — GLUCOSE, CAPILLARY
Glucose-Capillary: 235 mg/dL — ABNORMAL HIGH (ref 70–99)
Glucose-Capillary: 220 mg/dL — ABNORMAL HIGH (ref 70–99)

## 2023-11-22 SURGERY — TONSILLECTOMY
Anesthesia: General | Laterality: Bilateral

## 2023-11-22 MED ORDER — PREDNISONE 10 MG (21) PO TBPK: ORAL_TABLET | ORAL | 0 refills | Status: DC

## 2023-11-22 MED ORDER — OXYMETAZOLINE HCL 0.05 % NA SOLN
NASAL | Status: AC
  Filled 2023-11-22: qty 30

## 2023-11-22 MED ORDER — CHLORHEXIDINE GLUCONATE 0.12 % MT SOLN
15.0000 mL | Freq: Once | OROMUCOSAL | Status: AC
  Administered 2023-11-22: 15 mL via OROMUCOSAL

## 2023-11-22 MED ORDER — PROPOFOL 10 MG/ML IV BOLUS
INTRAVENOUS | Status: DC | PRN
  Administered 2023-11-22 (×2): 100 mg via INTRAVENOUS

## 2023-11-22 MED ORDER — BUPIVACAINE HCL (PF) 0.5 % IJ SOLN
INTRAMUSCULAR | Status: AC
  Filled 2023-11-22: qty 30

## 2023-11-22 MED ORDER — ACETAMINOPHEN 10 MG/ML IV SOLN
INTRAVENOUS | Status: DC | PRN
  Administered 2023-11-22: 1000 mg via INTRAVENOUS

## 2023-11-22 MED ORDER — SODIUM CHLORIDE 0.9 % IV SOLN: INTRAVENOUS | Status: DC

## 2023-11-22 MED ORDER — LIDOCAINE HCL (CARDIAC) PF 100 MG/5ML IV SOSY
PREFILLED_SYRINGE | INTRAVENOUS | Status: DC | PRN
  Administered 2023-11-22: 100 mg via INTRAVENOUS

## 2023-11-22 MED ORDER — ORAL CARE MOUTH RINSE: 15.0000 mL | Freq: Once | OROMUCOSAL | Status: AC

## 2023-11-22 MED ORDER — DEXMEDETOMIDINE HCL IN NACL 80 MCG/20ML IV SOLN
INTRAVENOUS | Status: DC | PRN
Start: 2023-11-22 — End: 2023-11-22
  Administered 2023-11-22: 12 ug via INTRAVENOUS
  Administered 2023-11-22: 8 ug via INTRAVENOUS

## 2023-11-22 MED ORDER — DROPERIDOL 2.5 MG/ML IJ SOLN
0.6250 mg | Freq: Once | INTRAMUSCULAR | Status: AC | PRN
  Administered 2023-11-22: 0.625 mg via INTRAVENOUS

## 2023-11-22 MED ORDER — OXYMETAZOLINE HCL 0.05 % NA SOLN
NASAL | Status: DC | PRN
  Administered 2023-11-22: 1 via TOPICAL

## 2023-11-22 MED ORDER — ROCURONIUM BROMIDE 100 MG/10ML IV SOLN
INTRAVENOUS | Status: DC | PRN
  Administered 2023-11-22: 50 mg via INTRAVENOUS

## 2023-11-22 MED ORDER — BUPIVACAINE HCL 0.5 % IJ SOLN
INTRAMUSCULAR | Status: DC | PRN
  Administered 2023-11-22: 2 mL

## 2023-11-22 MED ORDER — PROPOFOL 1000 MG/100ML IV EMUL
INTRAVENOUS | Status: AC
  Filled 2023-11-22: qty 100

## 2023-11-22 MED ORDER — FENTANYL CITRATE (PF) 100 MCG/2ML IJ SOLN
INTRAMUSCULAR | Status: DC | PRN
  Administered 2023-11-22: 100 ug via INTRAVENOUS
  Administered 2023-11-22 (×2): 25 ug via INTRAVENOUS

## 2023-11-22 MED ORDER — 0.9 % SODIUM CHLORIDE (POUR BTL) OPTIME
TOPICAL | Status: DC | PRN
  Administered 2023-11-22: 500 mL

## 2023-11-22 MED ORDER — OXYCODONE HCL 5 MG/5ML PO SOLN
5.0000 mg | Freq: Once | ORAL | Status: AC
  Administered 2023-11-22: 5 mg via ORAL

## 2023-11-22 MED ORDER — FENTANYL CITRATE (PF) 100 MCG/2ML IJ SOLN
INTRAMUSCULAR | Status: AC
  Filled 2023-11-22: qty 2

## 2023-11-22 MED ORDER — OXYCODONE HCL 5 MG/5ML PO SOLN: 5.0000 mg | Freq: Four times a day (QID) | ORAL | 0 refills | Status: DC | PRN

## 2023-11-22 MED ORDER — ONDANSETRON HCL 4 MG PO TABS: 4.0000 mg | ORAL_TABLET | Freq: Three times a day (TID) | ORAL | 0 refills | Status: DC | PRN

## 2023-11-22 MED ORDER — LACTATED RINGERS IV SOLN: INTRAVENOUS | Status: DC | PRN

## 2023-11-22 MED ORDER — OXYCODONE HCL 5 MG/5ML PO SOLN
ORAL | Status: AC
  Filled 2023-11-22: qty 5

## 2023-11-22 MED ORDER — ACETAMINOPHEN 10 MG/ML IV SOLN
INTRAVENOUS | Status: AC
  Filled 2023-11-22: qty 100

## 2023-11-22 MED ORDER — LIDOCAINE VISCOUS HCL 2 % MT SOLN: 10.0000 mL | Freq: Four times a day (QID) | OROMUCOSAL | 0 refills | Status: DC | PRN

## 2023-11-22 MED ORDER — MIDAZOLAM HCL 2 MG/2ML IJ SOLN
INTRAMUSCULAR | Status: AC
  Filled 2023-11-22: qty 2

## 2023-11-22 MED ORDER — SUGAMMADEX SODIUM 200 MG/2ML IV SOLN
INTRAVENOUS | Status: DC | PRN
  Administered 2023-11-22: 200 mg via INTRAVENOUS

## 2023-11-22 MED ORDER — DEXAMETHASONE SODIUM PHOSPHATE 10 MG/ML IJ SOLN
INTRAMUSCULAR | Status: DC | PRN
  Administered 2023-11-22: 10 mg via INTRAVENOUS

## 2023-11-22 MED ORDER — DROPERIDOL 2.5 MG/ML IJ SOLN
INTRAMUSCULAR | Status: AC
  Filled 2023-11-22: qty 2

## 2023-11-22 MED ORDER — CHLORHEXIDINE GLUCONATE 0.12 % MT SOLN
OROMUCOSAL | Status: AC
Start: 2023-11-22 — End: 2023-11-22
  Filled 2023-11-22: qty 15

## 2023-11-22 MED ORDER — FENTANYL CITRATE (PF) 100 MCG/2ML IJ SOLN: 25.0000 ug | INTRAMUSCULAR | Status: DC | PRN

## 2023-11-22 MED ORDER — MIDAZOLAM HCL 2 MG/2ML IJ SOLN
INTRAMUSCULAR | Status: DC | PRN
  Administered 2023-11-22: 2 mg via INTRAVENOUS

## 2023-11-22 MED ORDER — ONDANSETRON HCL 4 MG/2ML IJ SOLN
INTRAMUSCULAR | Status: DC | PRN
Start: 2023-11-22 — End: 2023-11-22
  Administered 2023-11-22: 4 mg via INTRAVENOUS

## 2023-11-22 SURGICAL SUPPLY — 17 items
BLADE BOVIE TIP EXT 4 (BLADE) ×2 IMPLANT
CATH ROBINSON RED A/P 10FR (CATHETERS) IMPLANT
CATH ROBINSON RED A/P 12FR (CATHETERS) IMPLANT
CATH ROBINSON RED A/P 14FR (CATHETERS) IMPLANT
ELECTRODE REM PT RTRN 9FT ADLT (ELECTROSURGICAL) ×2 IMPLANT
GAUZE 4X4 16PLY ~~LOC~~+RFID DBL (SPONGE) ×2 IMPLANT
GLOVE BIO SURGEON STRL SZ7.5 (GLOVE) ×2 IMPLANT
HANDLE SUCTION POOLE (INSTRUMENTS) ×2 IMPLANT
KIT TURNOVER KIT A (KITS) ×2 IMPLANT
MANIFOLD NEPTUNE II (INSTRUMENTS) ×2 IMPLANT
NS IRRIG 500ML POUR BTL (IV SOLUTION) ×2 IMPLANT
PACK HEAD/NECK (MISCELLANEOUS) ×2 IMPLANT
SPONGE TONSIL 1 RF SGL (DISPOSABLE) ×2 IMPLANT
SUCTION COAG ELEC 10 HAND CTRL (ELECTROSURGICAL) ×2 IMPLANT
SYR 3ML LL SCALE MARK (SYRINGE) ×2 IMPLANT
TRAP FLUID SMOKE EVACUATOR (MISCELLANEOUS) ×2 IMPLANT
WATER STERILE IRR 500ML POUR (IV SOLUTION) ×2 IMPLANT

## 2023-11-22 NOTE — Progress Notes (Signed)
 Patient tolerating ice chips and sips of water without difficulty. No bleeding noted. Liquid oxy 5 mg administered per order for pain.

## 2023-11-22 NOTE — Op Note (Signed)
..  11/22/2023  8:02 AM    Rachel Higgins  [id number]   Pre-Op Dx:  Hypertrophy of tonsils [J35.1], obstructive sleep apnea  Post-op Dx: Hypertrophy of tonsils [J35.1]. Obstructive sleep apnea  Proc:Tonsillectomy and Adenoidectomy > age 33  Surg: [name]  Anes:  General Endotracheal  EBL:  10ml  Comp:  None  Findings:  3+ tonsils with moderate fibrotic scar to underlying musculature, adenoids were ablated so no specimen obtained.  Procedure: After the patient was identified in holding and the history and physical and consent was reviewed, the patient was taken to the operating room and placed in a supine position.  General endotracheal anesthesia was induced in the normal fashion.  At this time, the patient was rotated 45 degrees and a shoulder roll was placed.  At this time, a McIvor mouthgag was inserted into the patient's oral cavity and suspended from the Mayo stand without injury to teeth, lips, or gums.  Next a red rubber catheter was inserted into the patient left nostril for retraction of the uvula and soft palate superiorly.  Next a curved Alice clamp was attached to the patient's right superior tonsillar pole and retracted medially and inferiorly.  A Bovie electrocautery was used to dissect the patient's right tonsil in a subcapsular plane.  Meticulous hemostasis was achieved with Bovie suction cautery.  At this time, the mouth gag was released from suspension for 1 minute.  Attention now was directed to the patient's left side.  In a similar fashion the curved Alice clamp was attached to the superior pole and this was retracted medially and inferiorly and the tonsil was excised in a subcapsular plane with Bovie electrocautery.  After completion of the second tonsil, meticulous hemostasis was continued.  At this time, attention was directed to the patient's Adenoidectomy.  Under indirect visualization using an operating mirror, the adenoid tissue was visualized and noted  to be obstructive in nature.  Using a Bovie suction cautery, the adenoid tissue was de bulked and debrided for a widely patent choana.  Folling debulking, the remaining adenoid tissue was ablated and desiccated with Bovie suction cautery.  Meticulous hemostasis was continued.  At this time, the patient's nasal cavity and oral cavity was irrigated with sterile saline.  2ml of 0.5% Marcaine  was injected into the anterior and posterior tonsillar fossa bilaterally.  Following this  The care of patient was returned to anesthesia, awakened, and transferred to recovery in stable condition.  Dispo:  PACU to home  Plan: Soft diet.  Limit exercise and strenuous activity for 2 weeks.  Fluid hydration  Recheck my office three weeks.   Trish Mancinelli 8:02 AM 11/22/2023

## 2023-11-22 NOTE — H&P (Signed)
..  History and Physical reviewed and updated date of procedure and patient seen and examined.

## 2023-11-22 NOTE — Transfer of Care (Signed)
 Immediate Anesthesia Transfer of Care Note  Patient: Rachel Higgins  Procedure(s) Performed: TONSILLECTOMY (Bilateral) ADENOIDECTOMY  Patient Location: PACU  Anesthesia Type:General  Level of Consciousness: awake and alert   Airway & Oxygen Therapy: Patient Spontanous Breathing and Patient connected to face mask oxygen  Post-op Assessment: Report given to RN and Post -op Vital signs reviewed and stable  Post vital signs: stable  Last Vitals:  Vitals Value Taken Time  BP 145/83 11/22/23 08:05  Temp    Pulse 71 11/22/23 08:09  Resp 13 11/22/23 08:09  SpO2 100 % 11/22/23 08:09  Vitals shown include unfiled device data.  Last Pain:  Vitals:   11/22/23 0618  TempSrc: Temporal  PainSc: 0-No pain         Complications: No notable events documented.

## 2023-11-22 NOTE — Anesthesia Procedure Notes (Signed)
 Procedure Name: Intubation Date/Time: 11/22/2023 7:47 AM  Performed by: Norleen Alberta HERO., CRNAPre-anesthesia Checklist: Patient identified, Patient being monitored, Timeout performed, Emergency Drugs available and Suction available Patient Re-evaluated:Patient Re-evaluated prior to induction Oxygen Delivery Method: Circle system utilized Preoxygenation: Pre-oxygenation with 100% oxygen Induction Type: IV induction Ventilation: Mask ventilation without difficulty Laryngoscope Size: 3 and McGrath Grade View: Grade I Tube type: Oral Tube size: 6.5 mm Number of attempts: 1 Airway Equipment and Method: Stylet Placement Confirmation: ETT inserted through vocal cords under direct vision, positive ETCO2 and breath sounds checked- equal and bilateral Secured at: 19 cm Tube secured with: Tape Dental Injury: Teeth and Oropharynx as per pre-operative assessment

## 2023-11-22 NOTE — Anesthesia Preprocedure Evaluation (Signed)
 Anesthesia Evaluation  Patient identified by MRN, date of birth, ID band Patient awake    Reviewed: Allergy & Precautions, H&P , NPO status , Patient's Chart, lab work & pertinent test results  History of Anesthesia Complications Negative for: history of anesthetic complications  Airway Mallampati: I  TM Distance: >3 FB Neck ROM: Full    Dental  (+) Teeth Intact, Dental Advidsory Given   Pulmonary neg shortness of breath, sleep apnea , neg COPD, neg recent URI, Current Smoker and Patient abstained from smoking.   breath sounds clear to auscultation       Cardiovascular hypertension, (-) angina (-) Past MI and (-) Cardiac Stents (-) dysrhythmias (-) Valvular Problems/Murmurs Rhythm:Regular Rate:Normal     Neuro/Psych  PSYCHIATRIC DISORDERS Anxiety Depression    negative neurological ROS     GI/Hepatic ,GERD  ,,  Endo/Other  diabetes, Well Controlled, Type 2, Oral Hypoglycemic Agents  Class 3 obesity  Renal/GU      Musculoskeletal   Abdominal   Peds  Hematology   Anesthesia Other Findings Past Medical History: 09/06/2019: Anxiety and depression 1996: Broken arm 09/26/2012: Contraceptive management 09/26/2012: Contraceptive management 09/06/2019: Encounter for well woman exam with routine gynecological  exam 07/03/2013: Hematuria 09/06/2019: Hypertension 05/18/2018: Late period 05/18/2018: LLQ pain No date: Migraine headache 07/03/2013: Painful urination 07/03/2013: Painful urination 01/15/2020: Patient desires pregnancy 12/30/2014: Postcoital bleeding 12/30/2014: Postcoital bleeding 11/03/2021: PTSD (post-traumatic stress disorder) 07/13/2013: Right ovarian cyst 07/20/2013: RUQ pain 07/20/2013: RUQ pain     Comment:  Has adenomyomatosis of GB refer to Dr Mavis appt 5/28               at 10 am No date: Thyroid  nodule 12/26/2013: Tired 12/26/2013: Tired 04/10/2021: Type 2 diabetes mellitus without  complication, without  long-term current use of insulin (HCC) 07/03/2013: Unspecified symptom associated with female genital organs   Reproductive/Obstetrics negative OB ROS                              Anesthesia Physical Anesthesia Plan  ASA: 3  Anesthesia Plan: General   Post-op Pain Management:    Induction: Intravenous  PONV Risk Score and Plan: 2 and Ondansetron , Dexamethasone , Midazolam  and Treatment may vary due to age or medical condition  Airway Management Planned: Oral ETT  Additional Equipment:   Intra-op Plan:   Post-operative Plan: Extubation in OR  Informed Consent: I have reviewed the patients History and Physical, chart, labs and discussed the procedure including the risks, benefits and alternatives for the proposed anesthesia with the patient or authorized representative who has indicated his/her understanding and acceptance.       Plan Discussed with:   Anesthesia Plan Comments:          Anesthesia Quick Evaluation

## 2023-11-23 ENCOUNTER — Encounter: Payer: Self-pay | Admitting: Otolaryngology

## 2023-11-23 LAB — SURGICAL PATHOLOGY

## 2023-11-28 ENCOUNTER — Other Ambulatory Visit: Payer: Self-pay | Admitting: *Deleted

## 2023-11-28 MED ORDER — LOSARTAN POTASSIUM 50 MG PO TABS: ORAL_TABLET | ORAL | 3 refills | Status: AC

## 2023-11-30 NOTE — Anesthesia Postprocedure Evaluation (Signed)
 Anesthesia Post Note  Patient: Rachel Higgins  Procedure(s) Performed: TONSILLECTOMY (Bilateral) ADENOIDECTOMY  Patient location during evaluation: PACU Anesthesia Type: General Level of consciousness: awake and alert Pain management: pain level controlled Vital Signs Assessment: post-procedure vital signs reviewed and stable Respiratory status: spontaneous breathing, nonlabored ventilation, respiratory function stable and patient connected to nasal cannula oxygen Cardiovascular status: blood pressure returned to baseline and stable Postop Assessment: no apparent nausea or vomiting Anesthetic complications: no   There were no known notable events for this encounter.   Last Vitals:  Vitals:   11/22/23 0845 11/22/23 0859  BP: 139/74 (!) 156/90  Pulse: 65 74  Resp:    Temp:  36.6 C  SpO2: 95% 98%    Last Pain:  Vitals:   11/22/23 0859  TempSrc: Axillary  PainSc: 4                  Prentice Murphy

## 2023-12-07 ENCOUNTER — Ambulatory Visit (HOSPITAL_COMMUNITY): Admitting: Psychiatry

## 2023-12-07 DIAGNOSIS — F4310 Post-traumatic stress disorder, unspecified: Secondary | ICD-10-CM | POA: Diagnosis not present

## 2023-12-07 DIAGNOSIS — F329 Major depressive disorder, single episode, unspecified: Secondary | ICD-10-CM | POA: Diagnosis not present
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
## 2023-12-21 ENCOUNTER — Ambulatory Visit (INDEPENDENT_AMBULATORY_CARE_PROVIDER_SITE_OTHER): Admitting: Psychiatry

## 2023-12-21 DIAGNOSIS — F329 Major depressive disorder, single episode, unspecified: Secondary | ICD-10-CM | POA: Diagnosis not present

## 2023-12-21 DIAGNOSIS — F4310 Post-traumatic stress disorder, unspecified: Secondary | ICD-10-CM | POA: Diagnosis not present
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
## 2023-12-25 ENCOUNTER — Other Ambulatory Visit: Payer: Self-pay | Admitting: Adult Health

## 2024-01-07 ENCOUNTER — Other Ambulatory Visit: Payer: Self-pay | Admitting: Adult Health

## 2024-02-01 ENCOUNTER — Ambulatory Visit (INDEPENDENT_AMBULATORY_CARE_PROVIDER_SITE_OTHER): Admitting: Psychiatry

## 2024-02-01 DIAGNOSIS — F4310 Post-traumatic stress disorder, unspecified: Secondary | ICD-10-CM
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
## 2024-02-02 ENCOUNTER — Ambulatory Visit: Admitting: Nurse Practitioner

## 2024-02-02 ENCOUNTER — Encounter: Payer: Self-pay | Admitting: Nurse Practitioner

## 2024-02-02 VITALS — BP 116/64 | Resp 18 | Ht 62.25 in | Wt 244.0 lb

## 2024-02-02 DIAGNOSIS — E119 Type 2 diabetes mellitus without complications: Secondary | ICD-10-CM

## 2024-02-02 DIAGNOSIS — Z7984 Long term (current) use of oral hypoglycemic drugs: Secondary | ICD-10-CM | POA: Diagnosis not present

## 2024-02-02 DIAGNOSIS — E559 Vitamin D deficiency, unspecified: Secondary | ICD-10-CM | POA: Diagnosis not present

## 2024-02-02 DIAGNOSIS — E041 Nontoxic single thyroid nodule: Secondary | ICD-10-CM | POA: Diagnosis not present

## 2024-02-02 LAB — LIPID PANEL
Chol/HDL Ratio: 4.1 ratio (ref 0.0–4.4)
Cholesterol, Total: 199 mg/dL (ref 100–199)
HDL: 49 mg/dL (ref >39)
LDL Chol Calc (NIH): 123 mg/dL — ABNORMAL HIGH (ref 0–99)
Triglycerides: 150 mg/dL — ABNORMAL HIGH (ref 0–149)
VLDL Cholesterol Cal: 27 mg/dL (ref 5–40)

## 2024-02-02 LAB — COMPREHENSIVE METABOLIC PANEL WITH GFR
ALT: 80 IU/L — ABNORMAL HIGH (ref 0–32)
AST: 71 IU/L — ABNORMAL HIGH (ref 0–40)
Albumin: 4.5 g/dL (ref 3.9–4.9)
Alkaline Phosphatase: 71 IU/L (ref 41–116)
BUN/Creatinine Ratio: 14 (ref 9–23)
BUN: 10 mg/dL (ref 6–20)
Bilirubin Total: 0.5 mg/dL (ref 0.0–1.2)
CO2: 18 mmol/L — ABNORMAL LOW (ref 20–29)
Calcium: 9.6 mg/dL (ref 8.7–10.2)
Chloride: 106 mmol/L (ref 96–106)
Creatinine, Ser: 0.74 mg/dL (ref 0.57–1.00)
Globulin, Total: 3.1 g/dL (ref 1.5–4.5)
Glucose: 199 mg/dL — ABNORMAL HIGH (ref 70–99)
Potassium: 4.5 mmol/L (ref 3.5–5.2)
Sodium: 133 mmol/L — ABNORMAL LOW (ref 134–144)
Total Protein: 7.6 g/dL (ref 6.0–8.5)
eGFR: 108 mL/min/1.73 (ref >59)

## 2024-02-02 LAB — POCT GLYCOSYLATED HEMOGLOBIN (HGB A1C): Hemoglobin A1C: 10.2 % — AB (ref 4.0–5.6)

## 2024-02-02 LAB — TSH: TSH: 1.07 u[IU]/mL (ref 0.450–4.500)

## 2024-02-02 LAB — T4, FREE: Free T4: 0.99 ng/dL (ref 0.82–1.77)

## 2024-02-02 LAB — VITAMIN D 25 HYDROXY (VIT D DEFICIENCY, FRACTURES): Vit D, 25-Hydroxy: 44.1 ng/mL (ref 30.0–100.0)

## 2024-02-02 MED ORDER — EMPAGLIFLOZIN 10 MG PO TABS: 10.0000 mg | ORAL_TABLET | Freq: Every day | ORAL | 1 refills | Status: DC

## 2024-02-02 NOTE — Progress Notes (Signed)
 Endocrinology Follow Up Note       02/02/2024, 9:28 PM   Subjective:    Patient ID: Rachel Higgins, female    DOB: 05-25-1990.  Rachel Higgins is being seen in follow up after being seen in consultation for management of currently uncontrolled symptomatic diabetes requested by  Pcp, No.   Past Medical History:  Diagnosis Date   Anxiety and depression 09/06/2019   Broken arm 1996   Contraceptive management 09/26/2012   Contraceptive management 09/26/2012   Encounter for well woman exam with routine gynecological exam 09/06/2019   Hematuria 07/03/2013   Hypertension 09/06/2019   Late period 05/18/2018   LLQ pain 05/18/2018   Migraine headache    Painful urination 07/03/2013   Painful urination 07/03/2013   Patient desires pregnancy 01/15/2020   Postcoital bleeding 12/30/2014   Postcoital bleeding 12/30/2014   PTSD (post-traumatic stress disorder) 11/03/2021   Right ovarian cyst 07/13/2013   RUQ pain 07/20/2013   RUQ pain 07/20/2013   Has adenomyomatosis of GB refer to Dr Mavis appt 5/28 at 10 am   Thyroid  nodule    Tired 12/26/2013   Tired 12/26/2013   Type 2 diabetes mellitus without complication, without long-term current use of insulin (HCC) 04/10/2021   Unspecified symptom associated with female genital organs 07/03/2013    Past Surgical History:  Procedure Laterality Date   ADENOIDECTOMY  11/22/2023   Procedure: ADENOIDECTOMY;  Surgeon: Milissa Hamming, MD;  Location: [facility];  Service: ENT;;   CHOLECYSTECTOMY N/A 09/03/2013   Procedure: LAPAROSCOPIC CHOLECYSTECTOMY;  Surgeon: Oneil DELENA Mavis, MD;  Location: [facility];  Service: General;  Laterality: N/A;   TONSILLECTOMY Bilateral 11/22/2023   Procedure: TONSILLECTOMY;  Surgeon: Milissa Hamming, MD;  Location: [facility];  Service: ENT;  Laterality: Bilateral;   WISDOM TOOTH EXTRACTION      Social History   Socioeconomic History   Marital status:  Single    Spouse name: Not on file   Number of children: Not on file   Years of education: Not on file   Highest education level: Not on file  Occupational History   Not on file  Tobacco Use   Smoking status: Every Day    Current packs/day: 0.50    Average packs/day: 0.5 packs/day for 20.0 years (10.0 ttl pk-yrs)    Types: Cigarettes, E-cigarettes   Smokeless tobacco: Never   Tobacco comments:    Reported no longer smoking cigarettes on 12/13/2022. Replaced with vaping and cartridge lasts 2 weeks at a time as of 05/02/2023  Vaping Use   Vaping status: Every Day   Substances: Nicotine, Flavoring  Substance and Sexual Activity   Alcohol use: No   Drug use: Not Currently   Sexual activity: Yes    Birth control/protection: Pill  Other Topics Concern   Not on file  Social History Narrative   Not on file   Social Drivers of Health   Financial Resource Strain: Medium Risk (04/07/2023)   Overall Financial Resource Strain (CARDIA)    Difficulty of Paying Living Expenses: Somewhat hard  Food Insecurity: No Food Insecurity (04/07/2023)   Hunger Vital Sign    Worried About Running Out of Food in the Last Year:  Never true    Ran Out of Food in the Last Year: Never true  Transportation Needs: No Transportation Needs (04/07/2023)   PRAPARE - Administrator, Civil Service (Medical): No    Lack of Transportation (Non-Medical): No  Physical Activity: Insufficiently Active (04/07/2023)   Exercise Vital Sign    Days of Exercise per Week: 3 days    Minutes of Exercise per Session: 30 min  Stress: Stress Concern Present (04/07/2023)   [employer] of Occupational Health - Occupational Stress Questionnaire    Feeling of Stress : To some extent  Social Connections: Moderately Isolated (04/07/2023)   Social Connection and Isolation Panel    Frequency of Communication with Friends and Family: Once a week    Frequency of Social Gatherings with Friends and Family: Three times a week     Attends Religious Services: More than 4 times per year    Active Member of Clubs or Organizations: No    Attends Banker Meetings: Never    Marital Status: Never married    Family History  Problem Relation Age of Onset   Vision loss Mother    Thyroid  disease Maternal Aunt    Thyroid  cancer Maternal Grandfather    Stroke Paternal Grandmother    Arthritis Paternal Grandmother    COPD Paternal Grandmother    Heart disease Paternal Grandmother    Breast cancer Paternal Grandmother    Lung cancer Paternal Grandmother    Diabetes Paternal Grandfather    Hyperlipidemia Father    Arthritis Father    Prostate cancer Father    Esophageal cancer Neg Hx    Pancreatic cancer Neg Hx    Stomach cancer Neg Hx    Colon cancer Neg Hx    Liver disease Neg Hx     Outpatient Encounter Medications as of 02/02/2024  Medication Sig   ACCU-CHEK GUIDE test strip USE 1 STRIP TO CHECK GLUCOSE 4 TIMES DAILY   Accu-Chek Softclix Lancets lancets Use as instructed   blood glucose meter kit and supplies KIT Dispense based on patient and insurance preference. Use up to four times daily as directed.   Blood Glucose Monitoring Suppl (ACCU-CHEK GUIDE) w/Device KIT Use to monitor glucose once daily   busPIRone  (BUSPAR ) 7.5 MG tablet Take 1 tablet (7.5 mg total) by mouth 2 (two) times daily.   Cholecalciferol  125 MCG (5000 UT) capsule Take 1 capsule (5,000 Units total) by mouth daily.   citalopram  (CELEXA ) 10 MG tablet Take 1 tablet (10 mg total) by mouth at bedtime.   empagliflozin (JARDIANCE) 10 MG TABS tablet Take 1 tablet (10 mg total) by mouth daily.   losartan  (COZAAR ) 50 MG tablet Take 1 tablet by mouth once daily   metFORMIN  (GLUCOPHAGE -XR) 500 MG 24 hr tablet TAKE 2 TABLETS BY MOUTH ONCE DAILY WITH BREAKFAST   norethindrone  (MICRONOR ) 0.35 MG tablet Take 1 tablet by mouth once daily   omeprazole  (PRILOSEC) 40 MG capsule Take 1 capsule by mouth once daily   simvastatin  (ZOCOR ) 10 MG tablet  Take 1 tablet by mouth once daily   vitamin B-12 (CYANOCOBALAMIN) 500 MCG tablet Take 500 mcg by mouth daily.   [DISCONTINUED] lidocaine  (XYLOCAINE ) 2 % solution Use as directed 10 mLs in the mouth or throat every 6 (six) hours as needed for mouth pain (Swish and spit).   [DISCONTINUED] ondansetron  (ZOFRAN ) 4 MG tablet Take 1 tablet (4 mg total) by mouth every 8 (eight) hours as needed for nausea or vomiting.   [  DISCONTINUED] oxyCODONE  (ROXICODONE ) 5 MG/5ML solution Take 5 mLs (5 mg total) by mouth every 6 (six) hours as needed for severe pain (pain score 7-10).   [DISCONTINUED] predniSONE  (STERAPRED UNI-PAK 21 TAB) 10 MG (21) TBPK tablet Sterapred DS 6 day taper   No facility-administered encounter medications on file as of 02/02/2024.    ALLERGIES: Allergies  Allergen Reactions   Adhesive [Tape]     Rash and burns skin     VACCINATION STATUS:  There is no immunization history on file for this patient.  Diabetes She presents for her follow-up diabetic visit. She has type 2 diabetes mellitus. Her disease course has been worsening. There are no hypoglycemic associated symptoms. Associated symptoms include weight loss. There are no hypoglycemic complications. Symptoms are stable. There are no diabetic complications. Risk factors for coronary artery disease include diabetes mellitus, dyslipidemia, family history, obesity, hypertension and tobacco exposure. When asked about current treatments, none were reported. Her weight is fluctuating minimally. She is following a generally unhealthy diet. When asked about meal planning, she reported none. She has had a previous visit with a dietitian. She participates in exercise intermittently. (She presents today, accompanied by her husband, after long absence with her meter showing no recent readings.  She has not monitored glucose in quite some time.  She has not been on her Metformin  for several months due to running out.  Her POCT A1c today is 10.2%,  increasing from last visit of 6.4%.  She admits her diet has not been the best, drinking more sweet tea.  She does note she wants to try a different medication than the Metformin  as it did cause GI upset and she does not feel it worked well for her.  ) An ACE inhibitor/angiotensin II receptor blocker is being taken. She does not see a podiatrist.Eye exam is current.    Review of systems  Constitutional: + Minimally fluctuating body weight,  current Body mass index is 44.27 kg/m. , no fatigue, no subjective hyperthermia, no subjective hypothermia Eyes: no blurry vision, no xerophthalmia ENT: no sore throat, no nodules palpated in throat, no dysphagia/odynophagia, no hoarseness Cardiovascular: no chest pain, no shortness of breath, no palpitations, no leg swelling Respiratory: no cough, no shortness of breath Gastrointestinal: no nausea/vomiting/diarrhea Musculoskeletal: no muscle/joint aches Skin: no rashes, no hyperemia Neurological: no tremors, no numbness, no tingling, no dizziness Psychiatric: no depression, no anxiety  Objective:     BP 116/64   Resp 18   Ht 5' 2.25 (1.581 m)   Wt 244 lb (110.7 kg)   BMI 44.27 kg/m   Wt Readings from Last 3 Encounters:  02/02/24 244 lb (110.7 kg)  11/22/23 250 lb (113.4 kg)  11/15/23 250 lb (113.4 kg)     BP Readings from Last 3 Encounters:  02/02/24 116/64  11/22/23 (!) 156/90  04/07/23 127/85      Physical Exam- Limited  Constitutional:  Body mass index is 44.27 kg/m. , not in acute distress, normal state of mind Eyes:  EOMI, no exophthalmos Musculoskeletal: no gross deformities, strength intact in all four extremities, no gross restriction of joint movements Skin:  no rashes, no hyperemia Neurological: no tremor with outstretched hands   Diabetic Foot Exam - Simple   No data filed     CMP ( most recent) CMP     Component Value Date/Time   NA 133 (L) 02/01/2024 0935   K 4.5 02/01/2024 0935   CL 106 02/01/2024 0935    CO2 18 (L)  02/01/2024 0935   GLUCOSE 199 (H) 02/01/2024 0935   GLUCOSE 92 07/09/2016 1636   BUN 10 02/01/2024 0935   CREATININE 0.74 02/01/2024 0935   CREATININE 0.66 12/26/2013 0908   CALCIUM 9.6 02/01/2024 0935   PROT 7.6 02/01/2024 0935   ALBUMIN 4.5 02/01/2024 0935   AST 71 (H) 02/01/2024 0935   ALT 80 (H) 02/01/2024 0935   ALKPHOS 71 02/01/2024 0935   BILITOT 0.5 02/01/2024 0935   GFRNONAA 112 01/21/2020 0812   GFRAA 129 01/21/2020 0812     Diabetic Labs (most recent): Lab Results  Component Value Date   HGBA1C 10.2 (A) 02/02/2024   HGBA1C 6.4 (A) 07/30/2022   HGBA1C 6.3 04/23/2022     Lipid Panel ( most recent) Lipid Panel     Component Value Date/Time   CHOL 199 02/01/2024 0935   TRIG 150 (H) 02/01/2024 0935   HDL 49 02/01/2024 0935   CHOLHDL 4.1 02/01/2024 0935   LDLCALC 123 (H) 02/01/2024 0935   LABVLDL 27 02/01/2024 0935      Lab Results  Component Value Date   TSH 1.070 02/01/2024   TSH 1.570 07/21/2022   TSH 2.070 01/21/2020   TSH 2.210 09/06/2018   TSH 2.370 01/03/2017   TSH 1.484 12/26/2013   FREET4 0.99 02/01/2024   FREET4 0.95 07/21/2022           Assessment & Plan:   1) Type 2 diabetes mellitus without complication, without long-term current use of insulin (HCC)  She presents today, accompanied by her husband, after long absence with her meter showing no recent readings.  She has not monitored glucose in quite some time.  She has not been on her Metformin  for several months due to running out.  Her POCT A1c today is 10.2%, increasing from last visit of 6.4%.  She admits her diet has not been the best, drinking more sweet tea.  She does note she wants to try a different medication than the Metformin  as it did cause GI upset and she does not feel it worked well for her.    - Najee Heritage has currently uncontrolled symptomatic type 2 DM since 33 years of age.   -Recent labs reviewed.  - I had a long discussion with her about the  progressive nature of diabetes and the pathology behind its complications. -her diabetes is not currently complicated but she remains at a high risk for more acute and chronic complications which include CAD, CVA, CKD, retinopathy, and neuropathy. These are all discussed in detail with her.  The following Lifestyle Medicine recommendations according to American College of Lifestyle Medicine [facility]) were discussed and offered to patient and she agrees to start the journey:  A. Whole Foods, Plant-based plate comprising of fruits and vegetables, plant-based proteins, whole-grain carbohydrates was discussed in detail with the patient.   A list for source of those nutrients were also provided to the patient.  Patient will use only water or unsweetened tea for hydration. B.  The need to stay away from risky substances including alcohol, smoking; obtaining 7 to 9 hours of restorative sleep, at least 150 minutes of moderate intensity exercise weekly, the importance of healthy social connections,  and stress reduction techniques were discussed. C.  A full color page of  Calorie density of various food groups per pound showing examples of each food groups was provided to the patient.  - Nutritional counseling repeated/built upon at each appointment.  - The patient admits there is a room for  improvement in their diet and drink choices. -  Suggestion is made for the patient to avoid simple carbohydrates from their diet including Cakes, Sweet Desserts / Pastries, Ice Cream, Soda (diet and regular), Sweet Tea, Candies, Chips, Cookies, Sweet Pastries, Store Bought Juices, Alcohol in Excess of 1-2 drinks a day, Artificial Sweeteners, Coffee Creamer, and Sugar-free Products. This will help patient to have stable blood glucose profile and potentially avoid unintended weight gain.   - I encouraged the patient to switch to unprocessed or minimally processed complex starch and increased protein intake (animal or plant  source), fruits, and vegetables.   - Patient is advised to stick to a routine mealtimes to eat 3 meals a day and avoid unnecessary snacks (to snack only to correct hypoglycemia).  - I have approached her with the following individualized plan to manage her diabetes and patient agrees:   -We did discuss different pharmacological options today with GLP1 vs DPP4 vs SGLT2 vs insulin.    - she is not an ideal candidate for GLP1 therapy due to family history of thyroid  cancer (grandfather).  She also has thyroid  nodule as well.  She is really interested in possibly trying this medication and despite the risks, would like to try it.  I did let her know she would need another surveillance thyroid  ultrasound to assess thyroid  nodule and so we can have baseline imaging.  I also expressed that I would feel comfortable with using this product for no more than a year due to this risk, but that it may kickstart her journey to health.  Ultimately, we decided to proceed with Jardiance 10 mg po daily for now.  We did go over potential side effects of this medication.  -She would like to avoid Metformin  due to significant GI upset when used previously.  -she is encouraged to continue monitoring blood glucose at least once daily, before breakfast 1-2 times per week, and to call the clinic if she has readings less than 70 or above 300 for 3 tests in a row.    - Adjustment parameters are given to her for hypo and hyperglycemia in writing.  - Specific targets for  A1c; LDL, HDL, and Triglycerides were discussed with the patient.  2) Blood Pressure /Hypertension:  her blood pressure is controlled to target.   she is advised to continue her current medications including Losartan  50 mg p.o. daily with breakfast-prescribed by her PCP.  3) Lipids/Hyperlipidemia:    Her recent lipid panel from 02/01/24 shows uncontrolled LDL of 123.  she is advised to continue Simvastatin  10 mg daily at bedtime.  Side effects and  precautions discussed with her.    4)  Weight/Diet:  her Body mass index is 44.27 kg/m.  -  clearly complicating her diabetes care.   she is a candidate for weight loss. I discussed with her the fact that loss of 5 - 10% of her  current body weight will have the most impact on her diabetes management.  Exercise, and detailed carbohydrates information provided  -  detailed on discharge instructions.  5) Chronic Care/Health Maintenance: -she is on ACEI/ARB and Statin medications and is encouraged to initiate and continue to follow up with Ophthalmology, Dentist, Podiatrist at least yearly or according to recommendations, and advised to QUIT SMOKING. I have recommended yearly flu vaccine and pneumonia vaccine at least every 5 years; moderate intensity exercise for up to 150 minutes weekly; and sleep for at least 7 hours a day.  6) Thyroid  nodule  Patient previously saw [facility] and had thyroid  ultrasound for nodule many years ago.  These records are not available to review.  Will order follow up ultrasound to be done (especially being that we may consider GLP1 use).  Her thyroid  function tests from this week were normal.   - she is advised to maintain close follow up with Pcp, No for primary care needs, as well as her other providers for optimal and coordinated care.     I spent  41  minutes in the care of the patient today including review of labs from CMP, Lipids, Thyroid  Function, Hematology (current and previous including abstractions from other facilities); face-to-face time discussing  her blood glucose readings/logs, discussing hypoglycemia and hyperglycemia episodes and symptoms, medications doses, her options of short and long term treatment based on the latest standards of care / guidelines;  discussion about incorporating lifestyle medicine;  and documenting the encounter. Risk reduction counseling performed per USPSTF guidelines to reduce obesity and cardiovascular risk factors.      Please refer to Patient Instructions for Blood Glucose Monitoring and Insulin/Medications Dosing Guide  in media tab for additional information. Please  also refer to  Patient Self Inventory in the Media  tab for reviewed elements of pertinent patient history.  Julien Barmore participated in the discussions, expressed understanding, and voiced agreement with the above plans.  All questions were answered to her satisfaction. she is encouraged to contact clinic should she have any questions or concerns prior to her return visit.     Follow up plan: - Return in about 3 months (around 05/02/2024) for Diabetes F/U with A1c in office, No previsit labs, Bring meter and logs.    Benton Rio, [facility] [address] [place], [place] [address] Phone: [phone] Fax: [phone]  02/02/2024, 9:28 PM

## 2024-02-15 ENCOUNTER — Ambulatory Visit (INDEPENDENT_AMBULATORY_CARE_PROVIDER_SITE_OTHER): Admitting: Psychiatry

## 2024-02-15 DIAGNOSIS — F329 Major depressive disorder, single episode, unspecified: Secondary | ICD-10-CM | POA: Diagnosis not present

## 2024-02-15 DIAGNOSIS — F4310 Post-traumatic stress disorder, unspecified: Secondary | ICD-10-CM | POA: Diagnosis not present
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
## 2024-02-29 ENCOUNTER — Ambulatory Visit (INDEPENDENT_AMBULATORY_CARE_PROVIDER_SITE_OTHER): Admitting: Psychiatry

## 2024-02-29 DIAGNOSIS — F4310 Post-traumatic stress disorder, unspecified: Secondary | ICD-10-CM | POA: Diagnosis not present

## 2024-02-29 DIAGNOSIS — F329 Major depressive disorder, single episode, unspecified: Secondary | ICD-10-CM
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
## 2024-03-14 ENCOUNTER — Ambulatory Visit (INDEPENDENT_AMBULATORY_CARE_PROVIDER_SITE_OTHER): Admitting: Psychiatry

## 2024-03-14 DIAGNOSIS — F4310 Post-traumatic stress disorder, unspecified: Secondary | ICD-10-CM | POA: Diagnosis not present

## 2024-03-14 DIAGNOSIS — F329 Major depressive disorder, single episode, unspecified: Secondary | ICD-10-CM | POA: Diagnosis not present
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
## 2024-03-30 ENCOUNTER — Other Ambulatory Visit: Payer: Self-pay | Admitting: Nurse Practitioner

## 2024-04-19 ENCOUNTER — Ambulatory Visit (HOSPITAL_COMMUNITY): Admitting: Registered Nurse

## 2024-05-02 ENCOUNTER — Ambulatory Visit: Admitting: Nurse Practitioner

## 2024-05-08 ENCOUNTER — Ambulatory Visit (HOSPITAL_COMMUNITY): Admitting: Psychiatry
